# Patient Record
Sex: Female | Born: 2000
Health system: Southern US, Community
[De-identification: ages and names within clinical notes are randomized; demographics above are authoritative.]

## PROBLEM LIST (undated history)

## (undated) DIAGNOSIS — G43909 Migraine, unspecified, not intractable, without status migrainosus: Secondary | ICD-10-CM

## (undated) DIAGNOSIS — F419 Anxiety disorder, unspecified: Secondary | ICD-10-CM

## (undated) DIAGNOSIS — T7840XA Allergy, unspecified, initial encounter: Secondary | ICD-10-CM

## (undated) DIAGNOSIS — J45909 Unspecified asthma, uncomplicated: Secondary | ICD-10-CM

## (undated) DIAGNOSIS — E7800 Pure hypercholesterolemia, unspecified: Secondary | ICD-10-CM

## (undated) HISTORY — DX: Unspecified asthma, uncomplicated: J45.909

## (undated) HISTORY — DX: Allergy, unspecified, initial encounter: T78.40XA

## (undated) HISTORY — DX: Anxiety disorder, unspecified: F41.9

## (undated) HISTORY — DX: Migraine, unspecified, not intractable, without status migrainosus: G43.909

## (undated) HISTORY — PX: WISDOM TOOTH EXTRACTION: SHX21

## (undated) HISTORY — DX: Pure hypercholesterolemia, unspecified: E78.00

---
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
## 2021-02-17 ENCOUNTER — Other Ambulatory Visit (INDEPENDENT_AMBULATORY_CARE_PROVIDER_SITE_OTHER): Payer: Self-pay | Admitting: Primary Care

## 2021-02-17 MED ORDER — BUDESONIDE 0.25 MG/2ML IN SUSP: RESPIRATORY_TRACT | 7 refills | Status: DC

## 2021-02-25 ENCOUNTER — Other Ambulatory Visit (INDEPENDENT_AMBULATORY_CARE_PROVIDER_SITE_OTHER): Payer: Self-pay | Admitting: Primary Care

## 2021-02-25 MED ORDER — ALBUTEROL SULFATE HFA 108 (90 BASE) MCG/ACT IN AERS: 2.0000 | INHALATION_SPRAY | Freq: Four times a day (QID) | RESPIRATORY_TRACT | 1 refills | Status: DC | PRN

## 2021-02-27 ENCOUNTER — Other Ambulatory Visit (INDEPENDENT_AMBULATORY_CARE_PROVIDER_SITE_OTHER): Payer: Self-pay | Admitting: Primary Care

## 2021-02-27 MED ORDER — MONTELUKAST SODIUM 10 MG PO TABS: 10.0000 mg | ORAL_TABLET | Freq: Every day | ORAL | 0 refills | Status: DC

## 2021-02-27 NOTE — Telephone Encounter (Signed)
Medication Refill - Medication: singulair 10 mg  Has the patient contacted their pharmacy? Yes.  Medication was denied. Pt has made an appt for 03-19-2021 with [facility] (with phone number or street name): [facility] [address] phone number [phone] Has the patient been seen for an appointment in the last year OR does the patient have an upcoming appointment? Yes.  Appt on 03-19-2021  Agent: Please be advised that RX refills may take up to 3 business days. We ask that you follow-up with your pharmacy.

## 2021-03-01 ENCOUNTER — Other Ambulatory Visit (INDEPENDENT_AMBULATORY_CARE_PROVIDER_SITE_OTHER): Payer: Self-pay | Admitting: Nurse Practitioner

## 2021-03-03 ENCOUNTER — Encounter (INDEPENDENT_AMBULATORY_CARE_PROVIDER_SITE_OTHER): Payer: Self-pay | Admitting: Primary Care

## 2021-03-03 ENCOUNTER — Ambulatory Visit (INDEPENDENT_AMBULATORY_CARE_PROVIDER_SITE_OTHER): Payer: Medicaid Other | Admitting: Primary Care

## 2021-03-03 ENCOUNTER — Other Ambulatory Visit: Payer: Self-pay

## 2021-03-03 VITALS — BP 134/80 | HR 97 | Temp 97.7°F | Ht 62.0 in | Wt 218.0 lb

## 2021-03-03 DIAGNOSIS — Z23 Encounter for immunization: Secondary | ICD-10-CM | POA: Diagnosis not present

## 2021-03-03 DIAGNOSIS — J4520 Mild intermittent asthma, uncomplicated: Secondary | ICD-10-CM

## 2021-03-03 DIAGNOSIS — R030 Elevated blood-pressure reading, without diagnosis of hypertension: Secondary | ICD-10-CM | POA: Diagnosis not present

## 2021-03-03 DIAGNOSIS — F4010 Social phobia, unspecified: Secondary | ICD-10-CM | POA: Diagnosis not present

## 2021-03-03 MED ORDER — PROPRANOLOL HCL 20 MG PO TABS: ORAL_TABLET | ORAL | 1 refills | Status: DC

## 2021-03-03 MED ORDER — BUDESONIDE-FORMOTEROL FUMARATE 80-4.5 MCG/ACT IN AERO: 2.0000 | INHALATION_SPRAY | Freq: Two times a day (BID) | RESPIRATORY_TRACT | 3 refills | Status: DC

## 2021-03-03 NOTE — Progress Notes (Signed)
[facility]   Subjective:     Mary Rose is an 20 y.o. obese female who presents for follow up of asthma. The patient is not currently have symptoms / an exacerbation. The patient has been having episodes for approximately  12  years. Symptoms in previous episodes have included dyspnea, non-productive cough, and wheezing, and typically last  flares up with season chage. Previous episodes have been triggered by animal dander, carpet in the room, emotional upset, fumes, pollens, and smoke. Treatments tried during prior episodes include short-acting inhaled beta-adrenergic agonists, which usually provides complete resolution of symptoms.  Current Disease Severity Leotta has monthly daytime asthma symptoms. She has frequent nighttime asthma symptoms. The patient is using short-acting beta agonists for symptom control more than 2 days per week but not more than once a day. She has exacerbations requiring oral systemic corticosteroids she did not realize QVAR was long acting . Current limitations in activity from asthma: none. Number of days of school or work missed in the last month: not applicable. Number of urgent/emergent visit in last year: 0   The following portions of the patient's history were reviewed and updated as appropriate: allergies, current medications, past family history, past medical history, past social history, and past surgical history.  Review of Systems Pertinent items noted in HPI and remainder of comprehensive ROS otherwise negative.    Objective:   BP 134/80 (BP Location: Right Arm, Patient Position: Sitting, Cuff Size: Large)   Pulse 97   Temp 97.7 F (36.5 C) (Temporal)   Ht 5\' 2"  (1.575 m)   Wt 218 lb (98.9 kg)   LMP 02/17/2021 (Exact Date)   SpO2 96%   BMI 39.87 kg/m   General: No apparent distress. Eyes: Extraocular eye movements intact, pupils equal and round. Neck: Supple, trachea midline. Thyroid: No enlargement, mobile without  fixation, no tenderness. Cardiovascular: Regular rhythm and rate, no murmur, normal radial pulses. Respiratory: Normal respiratory effort, clear to auscultation. Gastrointestinal: Normal pitch active bowel sounds, nontender abdomen without distention or appreciable hepatomegaly. Musculoskeletal: Normal muscle tone, no tenderness on palpation of tibia, no excessive thoracic kyphosis. Skin: Appropriate warmth, no visible rash. Mental status: Alert, conversant, speech clear, thought logical, appropriate mood and affect, no hallucinations or delusions evident. Hematologic/lymphatic: No cervical adenopathy, no visible ecchymoses.  Assessment:    Intermittent asthma, ongoing.     Plan:    Review treatment goals of symptom prevention, minimizing limitation in activity, and maintenance of optimal pulmonary function. Medications: no change. Discussed distinction between quick-relief and controlled medications. Discussed medication dosage, use, side effects, and goals of treatment in detail.   Reduce exposure to inhaled allergens: use impermeable mattress and pillow covers, vacuum 2x/week (the patient should not do the vacuuming), remove carpets in bedroom, and wash pet weekly.  Keilana was seen today for medication refill.  Diagnoses and all orders for this visit:  Mild intermittent asthma without complication She currently has QVAR Marena Chancy -     Discontinue: budesonide-formoterol (SYMBICORT) 80-4.5 MCG/ACT inhaler; Inhale 2 puffs into the lungs 2 (two) times daily.  Elevated blood pressure reading without diagnosis of hypertension Counseled on blood pressure goal of less than 130/80, low-sodium, DASH diet, medication compliance, 150 minutes of moderate intensity exercise per week. Reviewed how to read labels for sodium intake   Social anxiety disorder -     propranolol (INDERAL) 20 MG tablet; TAKE 1/2 TABLET BY MOUTH FOR ANXIETY FOR 1 WEEK THEN TAKE 1 TABLET AS NEEDED FOR  ANXIETY AND SITUATIONAL  ANXIETY  Grayce Sessions Np

## 2021-03-03 NOTE — Patient Instructions (Addendum)
Influenza, Adult Influenza is also called "the flu." It is an infection in the lungs, nose, and throat (respiratory tract). It spreads easily from person to person (is contagious). The flu causes symptoms that are like a cold, along with high fever and body aches. What are the causes? This condition is caused by the influenza virus. You can get the virus by: Breathing in droplets that are in the air after a person infected with the flu coughed or sneezed. Touching something that has the virus on it and then touching your mouth, nose, or eyes. What increases the risk? Certain things may make you more likely to get the flu. These include: Not washing your hands often. Having close contact with many people during cold and flu season. Touching your mouth, eyes, or nose without first washing your hands. Not getting a flu shot every year. You may have a higher risk for the flu, and serious problems, such as a lung infection (pneumonia), if you: Are older than 65. Are pregnant. Have a weakened disease-fighting system (immune system) because of a disease or because you are taking certain medicines. Have a long-term (chronic) condition, such as: Heart, kidney, or lung disease. Diabetes. Asthma. Have a liver disorder. Are very overweight (morbidly obese). Have anemia. What are the signs or symptoms? Symptoms usually begin suddenly and last 4-14 days. They may include: Fever and chills. Headaches, body aches, or muscle aches. Sore throat. Cough. Runny or stuffy (congested) nose. Feeling discomfort in your chest. Not wanting to eat as much as normal. Feeling weak or tired. Feeling dizzy. Feeling sick to your stomach or throwing up. How is this treated? If the flu is found early, you can be treated with antiviral medicine. This can help to reduce how bad the illness is and how long it lasts. This may be given by mouth or through an IV tube. Taking care of yourself at home can help your  symptoms get better. Your doctor may want you to: Take over-the-counter medicines. Drink plenty of fluids. The flu often goes away on its own. If you have very bad symptoms or other problems, you may be treated in a hospital. Follow these instructions at home:   Activity Rest as needed. Get plenty of sleep. Stay home from work or school as told by your doctor. Do not leave home until you do not have a fever for 24 hours without taking medicine. Leave home only to go to your doctor. Eating and drinking Take an ORS (oral rehydration solution). This is a drink that is sold at pharmacies and stores. Drink enough fluid to keep your pee pale yellow. Drink clear fluids in small amounts as you are able. Clear fluids include: Water. Ice chips. Fruit juice mixed with water. Low-calorie sports drinks. Eat bland foods that are easy to digest. Eat small amounts as you are able. These foods include: Bananas. Applesauce. Rice. Lean meats. Toast. Crackers. Do not eat or drink: Fluids that have a lot of sugar or caffeine. Alcohol. Spicy or fatty foods. General instructions Take over-the-counter and prescription medicines only as told by your doctor. Use a cool mist humidifier to add moisture to the air in your home. This can make it easier for you to breathe. When using a cool mist humidifier, clean it daily. Empty water and replace with clean water. Cover your mouth and nose when you cough or sneeze. Wash your hands with soap and water often and for at least 20 seconds. This is also important after  you cough or sneeze. If you cannot use soap and water, use alcohol-based hand sanitizer. Keep all follow-up visits. How is this prevented?  Get a flu shot every year. You may get the flu shot in late summer, fall, or winter. Ask your doctor when you should get your flu shot. Avoid contact with people who are sick during fall and winter. This is cold and flu season. Contact a doctor if: You get  new symptoms. You have: Chest pain. Watery poop (diarrhea). A fever. Your cough gets worse. You start to have more mucus. You feel sick to your stomach. You throw up. Get help right away if you: Have shortness of breath. Have trouble breathing. Have skin or nails that turn a bluish color. Have very bad pain or stiffness in your neck. Get a sudden headache. Get sudden pain in your face or ear. Cannot eat or drink without throwing up. These symptoms may represent a serious problem that is an emergency. Get medical help right away. Call your local emergency services (911 in the [place] Do not wait to see if the symptoms will go away. Do not drive yourself to the hospital. Summary Influenza is also called "the flu." It is an infection in the lungs, nose, and throat. It spreads easily from person to person. Take over-the-counter and prescription medicines only as told by your doctor. Getting a flu shot every year is the best way to not get the flu. This information is not intended to replace advice given to you by your health care provider. Make sure you discuss any questions you have with your health care provider. Document Revised: 01/11/2020 Document Reviewed: 01/11/2020 Elsevier Patient Education  2022 [employer]. Contraception Choices Contraception, also called birth control, refers to methods or devices that prevent pregnancy. Hormonal methods Contraceptive implant A contraceptive implant is a thin, plastic tube that contains a hormone that prevents pregnancy. It is different from an intrauterine device (IUD). It is inserted into the upper part of the arm by a health care provider. Implants can be effective for up to 3 years. Progestin-only injections Progestin-only injections are injections of progestin, a synthetic form of the hormone progesterone. They are given every 3 months by a health care provider. Birth control pills Birth control pills are pills that contain hormones  that prevent pregnancy. They must be taken once a day, preferably at the same time each day. A prescription is needed to use this method of contraception. Birth control patch The birth control patch contains hormones that prevent pregnancy. It is placed on the skin and must be changed once a week for three weeks and removed on the fourth week. A prescription is needed to use this method of contraception. Vaginal ring A vaginal ring contains hormones that prevent pregnancy. It is placed in the vagina for three weeks and removed on the fourth week. After that, the process is repeated with a new ring. A prescription is needed to use this method of contraception. Emergency contraceptive Emergency contraceptives prevent pregnancy after unprotected sex. They come in pill form and can be taken up to 5 days after sex. They work best the sooner they are taken after having sex. Most emergency contraceptives are available without a prescription. This method should not be used as your only form of birth control. Barrier methods Female condom A female condom is a thin sheath that is worn over the penis during sex. Condoms keep sperm from going inside a woman's body. They can be used with  a sperm-killing substance (spermicide) to increase their effectiveness. They should be thrown away after one use. Female condom A female condom is a soft, loose-fitting sheath that is put into the vagina before sex. The condom keeps sperm from going inside a woman's body. They should be thrown away after one use. Diaphragm A diaphragm is a soft, dome-shaped barrier. It is inserted into the vagina before sex, along with a spermicide. The diaphragm blocks sperm from entering the uterus, and the spermicide kills sperm. A diaphragm should be left in the vagina for 6-8 hours after sex and removed within 24 hours. A diaphragm is prescribed and fitted by a health care provider. A diaphragm should be replaced every 1-2 years, after giving  birth, after gaining more than 15 lb (6.8 kg), and after pelvic surgery. Cervical cap A cervical cap is a round, soft latex or plastic cup that fits over the cervix. It is inserted into the vagina before sex, along with spermicide. It blocks sperm from entering the uterus. The cap should be left in place for 6-8 hours after sex and removed within 48 hours. A cervical cap must be prescribed and fitted by a health care provider. It should be replaced every 2 years. Sponge A sponge is a soft, circular piece of polyurethane foam with spermicide in it. The sponge helps block sperm from entering the uterus, and the spermicide kills sperm. To use it, you make it wet and then insert it into the vagina. It should be inserted before sex, left in for at least 6 hours after sex, and removed and thrown away within 30 hours. Spermicides Spermicides are chemicals that kill or block sperm from entering the cervix and uterus. They can come as a cream, jelly, suppository, foam, or tablet. A spermicide should be inserted into the vagina with an applicator at least 10-15 minutes before sex to allow time for it to work. The process must be repeated every time you have sex. Spermicides do not require a prescription. Intrauterine contraception Intrauterine device (IUD) An IUD is a T-shaped device that is put in a woman's uterus. There are two types: Hormone IUD.This type contains progestin, a synthetic form of the hormone progesterone. This type can stay in place for 3-5 years. Copper IUD.This type is wrapped in copper wire. It can stay in place for 10 years. Permanent methods of contraception Female tubal ligation In this method, a woman's fallopian tubes are sealed, tied, or blocked during surgery to prevent eggs from traveling to the uterus. Hysteroscopic sterilization In this method, a small, flexible insert is placed into each fallopian tube. The inserts cause scar tissue to form in the fallopian tubes and block them,  so sperm cannot reach an egg. The procedure takes about 3 months to be effective. Another form of birth control must be used during those 3 months. Female sterilization This is a procedure to tie off the tubes that carry sperm (vasectomy). After the procedure, the man can still ejaculate fluid (semen). Another form of birth control must be used for 3 months after the procedure. Natural planning methods Natural family planning In this method, a couple does not have sex on days when the woman could become pregnant. Calendar method In this method, the woman keeps track of the length of each menstrual cycle, identifies the days when pregnancy can happen, and does not have sex on those days. Ovulation method In this method, a couple avoids sex during ovulation. Symptothermal method This method involves not having sex  during ovulation. The woman typically checks for ovulation by watching changes in her temperature and in the consistency of cervical mucus. Post-ovulation method In this method, a couple waits to have sex until after ovulation. Where to find more information Centers for Disease Control and Prevention: FootballExhibition.com.br Summary Contraception, also called birth control, refers to methods or devices that prevent pregnancy. Hormonal methods of contraception include implants, injections, pills, patches, vaginal rings, and emergency contraceptives. Barrier methods of contraception can include female condoms, female condoms, diaphragms, cervical caps, sponges, and spermicides. There are two types of IUDs (intrauterine devices). An IUD can be put in a woman's uterus to prevent pregnancy for 3-5 years. Permanent sterilization can be done through a procedure for males and females. Natural family planning methods involve nothaving sex on days when the woman could become pregnant. This information is not intended to replace advice given to you by your health care provider. Make sure you discuss any questions  you have with your health care provider. Document Revised: 10/29/2019 Document Reviewed: 10/29/2019 Elsevier Patient Education  2022 Elsevier Inc. Preventing Hypertension Hypertension, also called high blood pressure, is when the force of blood pumping through the arteries is too strong. Arteries are blood vessels that carry blood from the heart throughout the body. Often, hypertension does not cause symptoms until blood pressure is very high. It is important to have your blood pressure checked regularly. Diet and lifestyle changes can help you prevent hypertension, and they may make you feel better overall and improve your quality of life. If you already have hypertension, you may control it with diet and lifestyle changes, as well as with medicine. How can this condition affect me? Over time, hypertension can damage the arteries and decrease blood flow to important parts of the body, including the brain, heart, and kidneys. By keeping your blood pressure in a healthy range, you can help prevent complications like heart attack, heart failure, stroke, kidney failure, and vascular dementia. What can increase my risk? Being an older adult. Older people are more often affected. Having family members who have had high blood pressure. Being obese. Being female. Males are more likely to have high blood pressure. Drinking too much alcohol or caffeine. Smoking or using illegal drugs. Taking certain medicines, such as antidepressants, decongestants, birth control pills, and NSAIDs, such as ibuprofen. Having thyroid problems. Having certain tumors. What actions can I take to prevent or manage this condition? Work with your health care provider to make a hypertension prevention plan that works for you. Follow your plan and keep all follow-up visits as told by your health care provider. Diet changes Maintain a healthy diet. This includes: Eating less salt (sodium). Ask your health care provider how much  sodium is safe for you to have. The general recommendation is to have less than 1 tsp (2,300 mg) of sodium a day. Do not add salt to your food. Choose low-sodium options when grocery shopping and eating out. Limiting fats in your diet. You can do this by eating low-fat or fat-free dairy products and by eating less red meat. Eating more fruits, vegetables, and whole grains. Make a goal to eat: 1-2 cups of fresh fruits and vegetables each day. 3-4 servings of whole grains each day. Avoiding foods and beverages that have added sugars. Eating fish that contain healthy fats (omega-3 fatty acids), such as mackerel or salmon. If you need help putting together a healthy eating plan, try the DASH diet. This diet is high in fruits, vegetables, and  whole grains. It is low in sodium, red meat, and added sugars. DASH stands for Dietary Approaches to Stop Hypertension. Lifestyle changes Lose weight if you are overweight. Losing just 3?5% of your body weight can help prevent or control hypertension. For example, if your present weight is 200 lb (91 kg), a loss of 3-5% of your weight means losing 6-10 lb (2.7-4.5 kg). Ask your health care provider to help you with a diet and exercise plan to safely lose weight. Other recommendations usually include: Get enough exercise. Do at least 150 minutes of moderate-intensity exercise each week. You could do this in short exercise sessions several times a day, or you could do longer exercise sessions a few times a week. For example, you could take a brisk 10-minute walk or bike ride, 3 times a day, for 5 days a week. Find ways to reduce stress, such as exercising, meditating, listening to music, or taking a yoga class. If you need help reducing stress, ask your health care provider. Do not use any products that contain nicotine or tobacco, such as cigarettes, e-cigarettes, and chewing tobacco. If you need help quitting, ask your health care provider. Chemicals in tobacco and  nicotine products raise your blood pressure each time you use them. If you need help quitting, ask your health care provider. Learn how to check your blood pressure at home. Make sure that you know your personal target blood pressure, as told by your health care provider. Try to sleep 7-9 hours per night.  Alcohol use Do not drink alcohol if: Your health care provider tells you not to drink. You are pregnant, may be pregnant, or are planning to become pregnant. If you drink alcohol: Limit how much you use to: 0-1 drink a day for women. 0-2 drinks a day for men. Be aware of how much alcohol is in your drink. In the [place] one drink equals one 12 oz bottle of beer (355 mL), one 5 oz glass of wine (148 mL), or one 1 oz glass of hard liquor (44 mL). Medicines In addition to diet and lifestyle changes, your health care provider may recommend medicines to help lower your blood pressure. In general: You may need to try a few different medicines to find what works best for you. You may need to take more than one medicine. Take over-the-counter and prescription medicines only as told by your health care provider. Questions to ask your health care provider What is my blood pressure goal? How can I lower my risk for high blood pressure? How should I monitor my blood pressure at home? Where to find support Your health care provider can help you prevent hypertension and help you keep your blood pressure at a healthy level. Your local hospital or your community may also provide support services and prevention programs. The American Heart Association offers an online support network at supportnetwork.heart.org Where to find more information Learn more about hypertension from: National Heart, Lung, and Blood Institute: PopSteam.is Centers for Disease Control and Prevention: FootballExhibition.com.br American Academy of Family Physicians: familydoctor.org Learn more about the DASH diet from: National Heart,  Lung, and Blood Institute: PopSteam.is Contact a health care provider if: You think you are having a reaction to medicines you have taken. You have recurrent headaches or feel dizzy. You have swelling in your ankles. You have trouble with your vision. Get help right away if: You have sudden, severe chest, back, or abdominal pain or discomfort. You have shortness of breath. You have a  sudden, severe headache. These symptoms may represent a serious problem that is an emergency. Do not wait to see if the symptoms will go away. Get medical help right away. Call your local emergency services (911 in the [place] Do not drive yourself to the hospital.  Summary Hypertension often does not cause any symptoms until blood pressure is very high. It is important to get your blood pressure checked regularly. Diet and lifestyle changes are important steps in preventing hypertension. By keeping your blood pressure in a healthy range, you may prevent complications like heart attack, heart failure, stroke, and kidney failure. Work with your health care provider to make a hypertension prevention plan that works for you. This information is not intended to replace advice given to you by your health care provider. Make sure you discuss any questions you have with your health care provider. Document Revised: 04/24/2019 Document Reviewed: 04/24/2019 Elsevier Patient Education  2022 [employer].

## 2021-03-19 ENCOUNTER — Ambulatory Visit (INDEPENDENT_AMBULATORY_CARE_PROVIDER_SITE_OTHER): Payer: Medicaid Other | Admitting: Primary Care

## 2021-10-06 ENCOUNTER — Other Ambulatory Visit (INDEPENDENT_AMBULATORY_CARE_PROVIDER_SITE_OTHER): Payer: Self-pay | Admitting: Primary Care

## 2021-10-06 NOTE — Telephone Encounter (Signed)
Requested Prescriptions  ?Pending Prescriptions Disp Refills  ?? montelukast (SINGULAIR) 10 MG tablet [Pharmacy Med Name: MONTELUKAST 10MG  TABLETS] 90 tablet 0  ?  Sig: TAKE 1 TABLET(10 MG) BY MOUTH AT BEDTIME  ?  ? Pulmonology:  Leukotriene Inhibitors Passed - 10/06/2021  6:56 AM  ?  ?  Passed - Valid encounter within last 12 months  ?  Recent Outpatient Visits   ?      ? 7 months ago Mild intermittent asthma without complication  ? [facility], NP  ? 1 year ago Chronic pain of left knee  ? [facility], NP  ? 1 year ago Routine general medical examination at a health care facility  ? [facility], NP  ? 1 year ago Vaginal discharge  ? [facility], NP  ? 2 years ago Allergy, sequela  ? [facility], FNP  ?  ?  ? ?  ?  ?  ? ? ?

## 2022-02-22 ENCOUNTER — Ambulatory Visit (INDEPENDENT_AMBULATORY_CARE_PROVIDER_SITE_OTHER): Payer: Medicaid Other | Admitting: Family

## 2022-02-22 ENCOUNTER — Encounter: Payer: Self-pay | Admitting: Family

## 2022-02-22 VITALS — BP 136/98 | HR 76 | Temp 97.9°F | Ht 62.0 in | Wt 223.2 lb

## 2022-02-22 DIAGNOSIS — Z23 Encounter for immunization: Secondary | ICD-10-CM

## 2022-02-22 DIAGNOSIS — J4520 Mild intermittent asthma, uncomplicated: Secondary | ICD-10-CM

## 2022-02-22 DIAGNOSIS — G4762 Sleep related leg cramps: Secondary | ICD-10-CM | POA: Diagnosis not present

## 2022-02-22 NOTE — Patient Instructions (Addendum)
Welcome to [facility], It was a pleasure meeting you today!   For your leg cramp overnight, be sure to hydrate with 2 liters of water every day. OK to take Magnesium glycinate (look for chelated for better absorption) 1-2 times per day can help with this and has other benefits also: anxiety, maintaining blood pressure and daily bowels. Also be sure you are not sleeping under heavy covers or unable to move your legs during the night as this can bring on cramps.    PLEASE NOTE: If you had any LAB tests please let us know if you have not heard back within a few days. You may see your results on MyChart before we have a chance to review them but we will give you a call once they are reviewed by [place]. If we ordered any REFERRALS today, please let us know if you have not heard from their office within the next week.  Let us know through MyChart if you are needing REFILLS, or have your pharmacy send [place] the request. You can also use MyChart to communicate with me or any office staff.   Please try these tips to maintain a healthy lifestyle:  Eat most of your calories during the day when you are active. Eliminate processed foods including packaged sweets (pies, cakes, cookies), reduce intake of potatoes, white bread, white pasta, and white rice. Look for whole grain options, oat flour or almond flour.  Each meal should contain half fruits/vegetables, one quarter protein, and one quarter carbs (no bigger than a computer mouse).  Cut down on sweet beverages. This includes juice, soda, and sweet tea. Also watch fruit intake, though this is a healthier sweet option, it still contains natural sugar! Limit to 3 servings daily.  Drink at least 1 8oz. glass of water with each meal and aim for at least 8 glasses per day  Exercise at least 150 minutes every week.

## 2022-02-22 NOTE — Progress Notes (Signed)
New Patient Office Visit  Subjective:  Patient ID: Mary Rose, female    DOB: [birth year]  Age: 21 y.o. MRN: [id number]  CC:  Chief Complaint  Patient presents with   Establish Care    No concerns    HPI [name] presents for establishing care today.   Asthma:  pt reports using pulmicort in nebulizer and albuterol inhaler prn, takes Singulair qhs, Symbicort bid.  Leg cramps:  reports mostly in left leg, overnight, denies any excessive exercise during day, does not drink 2L water qd, denies pain during day.  Assessment & Plan:   Problem List Items Addressed This Visit       Respiratory   Mild intermittent asthma without complication - Primary    chronic  Symbicort bid, Singulair qhs, Pulmicort via Neb.prn, Albuterol prn no refills needed today f/u 6 mos or prn      Relevant Medications   SYMBICORT 80-4.5 MCG/ACT inhaler   Other Visit Diagnoses     Need for immunization against influenza       Relevant Orders   Flu Vaccine QUAD 6+ mos PF IM (Fluarix Quad PF) (Completed)   Nocturnal leg cramps    - Advised pt on hydrating with 2L water qd, do leg/calf stretches and apply heat to calves for 15-20 min prior to bedtime, keep loose bedding over legs, have room to move during night. OK to take Magnesium glycinate qhs.      Subjective:    Outpatient Medications Prior to Visit  Medication Sig Dispense Refill   albuterol (VENTOLIN HFA) 108 (90 Base) MCG/ACT inhaler Inhale 2 puffs into the lungs every 6 (six) hours as needed. For shortness of breath 18 g 1   budesonide (PULMICORT) 0.25 MG/2ML nebulizer solution TAKE 2 MLS BY NEBULIZATION TWICE DAILY AS NEEDED 60 mL 7   cetirizine (ZYRTEC) 10 MG tablet Take by mouth.     Cholecalciferol (VITAMIN D3) 50 MCG (2000 UT) TABS Take 1 tablet by mouth at bedtime.     montelukast (SINGULAIR) 10 MG tablet TAKE 1 TABLET(10 MG) BY MOUTH AT BEDTIME 90 tablet 0   propranolol (INDERAL) 20 MG tablet TAKE 1/2 TABLET BY MOUTH FOR  ANXIETY FOR 1 WEEK THEN TAKE 1 TABLET AS NEEDED FOR ANXIETY AND SITUATIONAL ANXIETY 90 tablet 1   SYMBICORT 80-4.5 MCG/ACT inhaler Inhale 2 puffs into the lungs 2 (two) times daily.     triamcinolone ointment (KENALOG) 0.1 %      Zinc 50 MG TABS Take 50 mg by mouth at bedtime.     montelukast (SINGULAIR) 10 MG tablet Take 1 tablet by mouth daily.     No facility-administered medications prior to visit.   Past Medical History:  Diagnosis Date   Allergy    Anxiety    Asthma    High cholesterol    Migraines    Past Surgical History:  Procedure Laterality Date   WISDOM TOOTH EXTRACTION      Objective:   Today's Vitals: BP (!) 136/98 (BP Location: Left Arm, Patient Position: Sitting, Cuff Size: Large)   Pulse 76   Temp 97.9 F (36.6 C) (Temporal)   Ht 5\' 2"  (1.575 m)   Wt 223 lb 4 oz (101.3 kg)   LMP 02/22/2022 (Exact Date)   SpO2 98%   BMI 40.83 kg/m   Physical Exam Vitals and nursing note reviewed.  Constitutional:      Appearance: Normal appearance. She is obese.  Cardiovascular:     Rate and Rhythm:  Normal rate and regular rhythm.  Pulmonary:     Effort: Pulmonary effort is normal.     Breath sounds: Normal breath sounds.  Musculoskeletal:        General: Normal range of motion.  Skin:    General: Skin is warm and dry.  Neurological:     Mental Status: She is alert.  Psychiatric:        Mood and Affect: Mood normal.        Behavior: Behavior normal.     Jeanie Sewer, NP

## 2022-02-22 NOTE — Assessment & Plan Note (Signed)
   chronic   Symbicort bid, Singulair qhs, Pulmicort via Neb.prn, Albuterol prn  no refills needed today  f/u 6 mos or prn

## 2022-02-26 ENCOUNTER — Other Ambulatory Visit: Payer: Self-pay | Admitting: Family

## 2022-02-26 ENCOUNTER — Other Ambulatory Visit (INDEPENDENT_AMBULATORY_CARE_PROVIDER_SITE_OTHER): Payer: Self-pay | Admitting: Primary Care

## 2022-02-26 MED ORDER — SYMBICORT 80-4.5 MCG/ACT IN AERO: 2.0000 | INHALATION_SPRAY | Freq: Two times a day (BID) | RESPIRATORY_TRACT | 0 refills | Status: DC

## 2022-03-01 ENCOUNTER — Other Ambulatory Visit: Payer: Self-pay | Admitting: Family

## 2022-04-03 ENCOUNTER — Other Ambulatory Visit: Payer: Self-pay | Admitting: Family

## 2022-05-02 ENCOUNTER — Other Ambulatory Visit: Payer: Self-pay | Admitting: Family

## 2022-05-03 ENCOUNTER — Other Ambulatory Visit: Payer: Self-pay | Admitting: Family

## 2022-05-12 ENCOUNTER — Encounter: Payer: Self-pay | Admitting: Family

## 2022-05-12 ENCOUNTER — Ambulatory Visit (INDEPENDENT_AMBULATORY_CARE_PROVIDER_SITE_OTHER): Payer: Medicaid Other | Admitting: Family

## 2022-05-12 VITALS — BP 120/62 | HR 80 | Temp 98.7°F | Ht 62.0 in | Wt 220.5 lb

## 2022-05-12 DIAGNOSIS — F411 Generalized anxiety disorder: Secondary | ICD-10-CM | POA: Diagnosis not present

## 2022-05-12 DIAGNOSIS — J4520 Mild intermittent asthma, uncomplicated: Secondary | ICD-10-CM

## 2022-05-12 MED ORDER — SYMBICORT 80-4.5 MCG/ACT IN AERO: 2.0000 | INHALATION_SPRAY | Freq: Two times a day (BID) | RESPIRATORY_TRACT | 2 refills | Status: DC

## 2022-05-12 MED ORDER — SYMBICORT 80-4.5 MCG/ACT IN AERO: 2.0000 | INHALATION_SPRAY | Freq: Two times a day (BID) | RESPIRATORY_TRACT | 0 refills | Status: DC

## 2022-05-12 NOTE — Assessment & Plan Note (Signed)
chronic pt taking Propranolol 20mg  qd prn no concerns, no refill needed today f/u 6 mos or prn

## 2022-05-12 NOTE — Assessment & Plan Note (Signed)
chronic - stable Symbicort bid, Singulair qhs, Pulmicort via Neb.prn, Albuterol prn just Symbicort refill needed today f/u 6 mos or prn

## 2022-05-12 NOTE — Progress Notes (Signed)
Patient ID: Mary Rose, female    DOB: 2000-09-30, 21 y.o.   MRN: [id number]  Chief Complaint  Patient presents with   Asthma    Medication refill, Symbicort   HPI: Asthma:  pt reports using pulmicort in nebulizer and albuterol inhaler prn, takes Singulair qhs, Symbicort bid, sx have remained under control, no exacerbations.  Anxiety:  pt reports having for over a year, taking Propranolol 20mg  qd prn, pt reports this is working, no concerns.   Assessment & Plan:   Problem List Items Addressed This Visit       Respiratory   Mild intermittent asthma without complication - Primary    chronic - stable Symbicort bid, Singulair qhs, Pulmicort via Neb.prn, Albuterol prn just Symbicort refill needed today f/u 6 mos or prn      Relevant Medications   SYMBICORT 80-4.5 MCG/ACT inhaler     Other   Generalized anxiety disorder    chronic pt taking Propranolol 20mg  qd prn no concerns, no refill needed today f/u 6 mos or prn       Subjective:    Outpatient Medications Prior to Visit  Medication Sig Dispense Refill   budesonide (PULMICORT) 0.25 MG/2ML nebulizer solution USE 2 ML VIA NEBULIZER TWICE DAILY AS NEEDED 60 mL 7   cetirizine (ZYRTEC) 10 MG tablet Take by mouth.     Cholecalciferol (VITAMIN D3) 50 MCG (2000 UT) TABS Take 1 tablet by mouth at bedtime.     montelukast (SINGULAIR) 10 MG tablet TAKE 1 TABLET(10 MG) BY MOUTH AT BEDTIME 90 tablet 0   propranolol (INDERAL) 20 MG tablet TAKE 1/2 TABLET BY MOUTH FOR ANXIETY FOR 1 WEEK THEN TAKE 1 TABLET AS NEEDED FOR ANXIETY AND SITUATIONAL ANXIETY 90 tablet 1   triamcinolone ointment (KENALOG) 0.1 %      VENTOLIN HFA 108 (90 Base) MCG/ACT inhaler INHALE 2 PUFFS INTO THE LUNGS EVERY 4 HOURS FOR UP TO 7 DAYS AS NEEDED FOR WHEEZING 18 g 1   Zinc 50 MG TABS Take 50 mg by mouth at bedtime.     SYMBICORT 80-4.5 MCG/ACT inhaler INHALE 2 PUFFS INTO THE LUNGS TWICE DAILY 10.2 g 0   No facility-administered medications prior to  visit.   Past Medical History:  Diagnosis Date   Allergy    Anxiety    Asthma    High cholesterol    Migraines    Past Surgical History:  Procedure Laterality Date   WISDOM TOOTH EXTRACTION     Allergies  Allergen Reactions   Molds & Smuts Shortness Of Breath      Objective:    Physical Exam Vitals and nursing note reviewed.  Constitutional:      Appearance: Normal appearance. She is obese.  Cardiovascular:     Rate and Rhythm: Normal rate and regular rhythm.  Pulmonary:     Effort: Pulmonary effort is normal.     Breath sounds: Normal breath sounds.  Musculoskeletal:        General: Normal range of motion.  Skin:    General: Skin is warm and dry.  Neurological:     Mental Status: She is alert.  Psychiatric:        Mood and Affect: Mood normal.        Behavior: Behavior normal.    BP 120/62 (BP Location: Left Arm, Patient Position: Sitting, Cuff Size: Large)   Pulse 80   Temp 98.7 F (37.1 C) (Temporal)   Ht 5\' 2"  (1.575 m)   Wt  220 lb 8 oz (100 kg)   LMP 04/22/2022 (Exact Date)   SpO2 98%   BMI 40.33 kg/m  Wt Readings from Last 3 Encounters:  05/12/22 220 lb 8 oz (100 kg)  02/22/22 223 lb 4 oz (101.3 kg)  03/03/21 218 lb (98.9 kg) (99 %, Z= 2.18)*   * Growth percentiles are based on CDC (Girls, 2-20 Years) data.       Jeanie Sewer, NP

## 2022-05-24 ENCOUNTER — Encounter: Payer: Medicaid Other | Admitting: Family

## 2022-05-25 ENCOUNTER — Encounter: Payer: Self-pay | Admitting: Family

## 2022-05-25 ENCOUNTER — Ambulatory Visit (INDEPENDENT_AMBULATORY_CARE_PROVIDER_SITE_OTHER): Payer: Medicaid Other | Admitting: Family

## 2022-05-25 VITALS — BP 125/85 | HR 85 | Temp 97.9°F | Ht 62.0 in | Wt 218.2 lb

## 2022-05-25 DIAGNOSIS — Z0000 Encounter for general adult medical examination without abnormal findings: Secondary | ICD-10-CM | POA: Diagnosis not present

## 2022-05-25 LAB — CBC WITH DIFFERENTIAL/PLATELET
Basophils Absolute: 0 10*3/uL (ref 0.0–0.1)
Basophils Relative: 0.6 % (ref 0.0–3.0)
Eosinophils Absolute: 0.4 10*3/uL (ref 0.0–0.7)
Eosinophils Relative: 5.9 % — ABNORMAL HIGH (ref 0.0–5.0)
HCT: 38.4 % (ref 36.0–46.0)
Hemoglobin: 12.7 g/dL (ref 12.0–15.0)
Lymphocytes Relative: 26.2 % (ref 12.0–46.0)
Lymphs Abs: 1.9 10*3/uL (ref 0.7–4.0)
MCHC: 33.2 g/dL (ref 30.0–36.0)
MCV: 88.5 fl (ref 78.0–100.0)
Monocytes Absolute: 0.6 10*3/uL (ref 0.1–1.0)
Monocytes Relative: 8.5 % (ref 3.0–12.0)
Neutro Abs: 4.4 10*3/uL (ref 1.4–7.7)
Neutrophils Relative %: 58.8 % (ref 43.0–77.0)
Platelets: 336 10*3/uL (ref 150.0–400.0)
RBC: 4.34 Mil/uL (ref 3.87–5.11)
RDW: 13.7 % (ref 11.5–14.6)
WBC: 7.4 10*3/uL (ref 4.5–10.5)

## 2022-05-25 LAB — COMPREHENSIVE METABOLIC PANEL
ALT: 20 U/L (ref 0–35)
AST: 14 U/L (ref 0–37)
Albumin: 4.5 g/dL (ref 3.5–5.2)
Alkaline Phosphatase: 55 U/L (ref 39–117)
BUN: 12 mg/dL (ref 6–23)
CO2: 28 mEq/L (ref 19–32)
Calcium: 9.5 mg/dL (ref 8.4–10.5)
Chloride: 102 mEq/L (ref 96–112)
Creatinine, Ser: 0.65 mg/dL (ref 0.40–1.20)
GFR: 126.44 mL/min (ref >60.00)
Glucose, Bld: 83 mg/dL (ref 70–99)
Potassium: 4.4 mEq/L (ref 3.5–5.1)
Sodium: 139 mEq/L (ref 135–145)
Total Bilirubin: 0.6 mg/dL (ref 0.2–1.2)
Total Protein: 7.1 g/dL (ref 6.0–8.3)

## 2022-05-25 LAB — TSH: TSH: 1.87 u[IU]/mL (ref 0.35–5.50)

## 2022-05-25 LAB — LIPID PANEL
Cholesterol: 181 mg/dL (ref 0–200)
HDL: 40.9 mg/dL (ref >39.00)
LDL Cholesterol: 124 mg/dL — ABNORMAL HIGH (ref 0–99)
NonHDL: 140.24
Total CHOL/HDL Ratio: 4
Triglycerides: 82 mg/dL (ref 0.0–149.0)
VLDL: 16.4 mg/dL (ref 0.0–40.0)

## 2022-05-25 NOTE — Progress Notes (Signed)
Phone [phone]  Subjective:   Patient is a 21 y.o. female presenting for annual physical.    Chief Complaint  Patient presents with   Annual Exam    Fasting w/ Labs   See problem oriented charting- ROS- full  review of systems was completed and negative   The following were reviewed and entered/updated in epic: Past Medical History:  Diagnosis Date   Allergy    Anxiety    Asthma    High cholesterol    Migraines    Patient Active Problem List   Diagnosis Date Noted   Generalized anxiety disorder 05/12/2022   Mild intermittent asthma without complication 62/13/0865   Past Surgical History:  Procedure Laterality Date   WISDOM TOOTH EXTRACTION      Family History  Problem Relation Age of Onset   Heart disease Father    Hyperlipidemia Father    Hypertension Father    Arthritis Maternal Grandmother    Birth defects Maternal Grandfather    Early death Maternal Grandfather     Medications- reviewed and updated Current Outpatient Medications  Medication Sig Dispense Refill   budesonide (PULMICORT) 0.25 MG/2ML nebulizer solution USE 2 ML VIA NEBULIZER TWICE DAILY AS NEEDED 60 mL 7   cetirizine (ZYRTEC) 10 MG tablet Take by mouth.     Cholecalciferol (VITAMIN D3) 50 MCG (2000 UT) TABS Take 1 tablet by mouth at bedtime.     montelukast (SINGULAIR) 10 MG tablet TAKE 1 TABLET(10 MG) BY MOUTH AT BEDTIME 90 tablet 0   propranolol (INDERAL) 20 MG tablet TAKE 1/2 TABLET BY MOUTH FOR ANXIETY FOR 1 WEEK THEN TAKE 1 TABLET AS NEEDED FOR ANXIETY AND SITUATIONAL ANXIETY 90 tablet 1   SYMBICORT 80-4.5 MCG/ACT inhaler Inhale 2 puffs into the lungs 2 (two) times daily. 10.2 g 2   triamcinolone ointment (KENALOG) 0.1 %      VENTOLIN HFA 108 (90 Base) MCG/ACT inhaler INHALE 2 PUFFS INTO THE LUNGS EVERY 4 HOURS FOR UP TO 7 DAYS AS NEEDED FOR WHEEZING 18 g 1   Zinc 50 MG TABS Take 50 mg by mouth at bedtime.     No current facility-administered medications for this visit.    Allergies-reviewed and updated Allergies  Allergen Reactions   Molds & Smuts Shortness Of Breath    Social History   Social History Narrative   pt is a full time Ship broker at [employer], interested in pre-med   working part time    Objective:  BP 125/85 (BP Location: Left Arm, Patient Position: Sitting, Cuff Size: Large)   Pulse 85   Temp 97.9 F (36.6 C) (Temporal)   Ht _0  (1.575 m)   Wt 218 lb 4 oz (99 kg)   LMP 05/21/2022 (Exact Date)   SpO2 99%   BMI 39.92 kg/m  Physical Exam Vitals and nursing note reviewed.  Constitutional:      Appearance: Normal appearance.  HENT:     Head: Normocephalic.     Right Ear: Tympanic membrane normal.     Left Ear: Tympanic membrane normal.     Nose: Nose normal.     Mouth/Throat:     Mouth: Mucous membranes are moist.  Eyes:     Pupils: Pupils are equal, round, and reactive to light.  Cardiovascular:     Rate and Rhythm: Normal rate and regular rhythm.  Pulmonary:     Effort: Pulmonary effort is normal.     Breath sounds: Normal breath sounds.  Musculoskeletal:  General: Normal range of motion.     Cervical back: Normal range of motion.  Lymphadenopathy:     Cervical: No cervical adenopathy.  Skin:    General: Skin is warm and dry.  Neurological:     Mental Status: She is alert.  Psychiatric:        Mood and Affect: Mood normal.        Behavior: Behavior normal.       Assessment and Plan  Health Maintenance counseling: 1. Anticipatory guidance: Patient counseled regarding regular dental exams q6 months, eye exams,  avoiding smoking and second hand smoke, limiting alcohol to 1 beverage per day, no illicit drugs.   2. Risk factor reduction:  Advised patient of need for regular exercise and diet rich with fruits and vegetables to reduce risk of heart attack and stroke. Wt Readings from Last 3 Encounters:  05/25/22 218 lb 4 oz (99 kg)  05/12/22 220 lb 8 oz (100 kg)  02/22/22 223 lb 4 oz (101.3 kg)   3.  Immunizations/screenings/ancillary studies Immunization History  Administered Date(s) Administered   DTaP 10/23/2001, 12/29/2001, 02/16/2002, 01/22/2003, 09/09/2006   HIB (PRP-OMP) 10/23/2001, 12/29/2001, 02/16/2002, 01/22/2003   Hep A, Unspecified 09/09/2006, 07/17/2007   Hepatitis A 09/09/2006, 07/17/2007   Hepatitis B 10/29/2001, 09/21/2001, 05/21/2002   Hepatitis B, PED/ADOLESCENT September 30, 2001, 09/21/2001, 05/21/2002   Hpv-Unspecified 02/09/2013, 02/14/2015   IPV 10/23/2001, 12/29/2001, 05/21/2002, 09/09/2006   Influenza,inj,Quad PF,6+ Mos 07/05/2018, 02/28/2020, 03/03/2021, 02/22/2022   MMR 08/20/2002, 09/09/2006   Td 02/09/2013   Td (Adult),5 Lf Tetanus Toxid, Preservative Free 02/09/2013   Tdap 02/09/2013   Varicella 08/20/2002, 09/09/2006   Health Maintenance Due  Topic Date Due   CHLAMYDIA SCREENING  10/28/2020    4. Cervical cancer screening: due, last one 97yr ago, Gardasil utd 5. Skin cancer screening- advised regular sunscreen use. Denies worrisome, changing, or new skin lesions.  6. Birth control/STD check: none 7. Smoking associated screening: non- smoker 8. Alcohol screening: none  Problem List Items Addressed This Visit   None Visit Diagnoses     Annual physical exam    -  Primary   Relevant Orders   Comprehensive metabolic panel   TSH   Lipid panel   CBC with Differential/Platelet       Recommended follow up:  No follow-ups on file. No future appointments.  Lab/Order associations:fasting    HJeanie Sewer NP

## 2022-05-25 NOTE — Patient Instructions (Signed)
It was very nice to see you today!   I will review your lab results via MyChart in a few days.   Have a very Merry Christmas!!    PLEASE NOTE:  If you had any lab tests please let us know if you have not heard back within a few days. You may see your results on MyChart before we have a chance to review them but we will give you a call once they are reviewed by [place]. If we ordered any referrals today, please let us know if you have not heard from their office within the next week.

## 2022-05-27 ENCOUNTER — Other Ambulatory Visit: Payer: Self-pay | Admitting: Family

## 2022-05-28 NOTE — Progress Notes (Signed)
Your glucose, electrolytes, blood count, thyroid, liver & kidney function are all normal.  Your cholesterol numbers look good, just your LDL (bad #) is high. Try to reduce any fried foods, alcohol, nonnutritional snacks e.g. chips/cookies,pies, cakes and candies, fatty meat (red meat), high fat dairy foods:  including cheese, milk, ice cream.  Increase intake of whole foods: fruits/vegetables/fiber.   Continue or restart an exercise routine, shooting for 5-7days per week.

## 2022-06-19 ENCOUNTER — Ambulatory Visit
Admission: EM | Admit: 2022-06-19 | Discharge: 2022-06-19 | Disposition: A | Discharge status: Home / Self Care | Payer: Medicaid Other | Attending: Emergency Medicine | Admitting: Emergency Medicine

## 2022-06-19 ENCOUNTER — Encounter: Payer: Self-pay | Admitting: Emergency Medicine

## 2022-06-19 DIAGNOSIS — Z113 Encounter for screening for infections with a predominantly sexual mode of transmission: Secondary | ICD-10-CM | POA: Diagnosis not present

## 2022-06-19 LAB — POCT URINALYSIS DIP (MANUAL ENTRY)
Bilirubin, UA: NEGATIVE
Glucose, UA: NEGATIVE mg/dL
Leukocytes, UA: NEGATIVE
Nitrite, UA: NEGATIVE
Protein Ur, POC: 30 mg/dL — AB
Spec Grav, UA: >=1.03 — AB (ref 1.010–1.025)
Urobilinogen, UA: 0.2 E.U./dL
pH, UA: 5.5 (ref 5.0–8.0)

## 2022-06-19 LAB — POCT URINE PREGNANCY: Preg Test, Ur: NEGATIVE

## 2022-06-19 NOTE — Discharge Instructions (Addendum)
The results of your STD testing today will be posted to your MyChart account once it is complete.  This typically takes 2 to 4 days.  Please abstain from sexual intercourse of any kind, vaginal, oral or anal, until you have received the results of your STD testing.   If any of your results are abnormal, you will receive a phone call regarding treatment.  Prescriptions, if any are needed, will be provided for you at your pharmacy.   Your urine pregnancy test today is negative. Our point-of-care analysis of your urine sample today was normal and did not reveal any concern for urinary tract infection. Thank you for visiting urgent care today.

## 2022-06-19 NOTE — ED Triage Notes (Signed)
Patient requesting STI screening including bloodwork.  No sx's.  Just wants a check up.

## 2022-06-19 NOTE — ED Provider Notes (Signed)
[facility]    CSN: [id number] Arrival date & time: 06/19/22  1548    HISTORY   Chief Complaint  Patient presents with   STI testing   HPI Britteny Fiebelkorn is a pleasant, 22 y.o. female who presents to urgent care today. Patient requesting STI screening including bloodwork.  Denies symptoms of STDs, states that she just wants a check up.  The history is provided by the patient.   Past Medical History:  Diagnosis Date   Allergy    Anxiety    Asthma    High cholesterol    Migraines    Patient Active Problem List   Diagnosis Date Noted   Generalized anxiety disorder 05/12/2022   Mild intermittent asthma without complication 37/62/8315   Past Surgical History:  Procedure Laterality Date   WISDOM TOOTH EXTRACTION     OB History   No obstetric history on file.    Home Medications    Prior to Admission medications   Medication Sig Start Date End Date Taking? Authorizing Provider  budesonide (PULMICORT) 0.25 MG/2ML nebulizer solution USE 2 ML VIA NEBULIZER TWICE DAILY AS NEEDED 03/02/22  Yes Hudnell, Colletta [place], NP  cetirizine (ZYRTEC) 10 MG tablet Take by mouth.   Yes [provider]  Cholecalciferol (VITAMIN D3) 50 MCG (2000 UT) TABS Take 1 tablet by mouth at bedtime.   Yes [provider]  montelukast (SINGULAIR) 10 MG tablet TAKE 1 TABLET(10 MG) BY MOUTH AT BEDTIME 05/27/22  Yes Hudnell, Stephanie, NP  propranolol (INDERAL) 20 MG tablet TAKE 1/2 TABLET BY MOUTH FOR ANXIETY FOR 1 WEEK THEN TAKE 1 TABLET AS NEEDED FOR ANXIETY AND SITUATIONAL ANXIETY 03/03/21  Yes Kerin Perna, NP  SYMBICORT 80-4.5 MCG/ACT inhaler Inhale 2 puffs into the lungs 2 (two) times daily. 05/12/22  Yes Jeanie Sewer, NP  triamcinolone ointment (KENALOG) 0.1 %  07/01/18  Yes [provider]  VENTOLIN HFA 108 (90 Base) MCG/ACT inhaler INHALE 2 PUFFS INTO THE LUNGS EVERY 4 HOURS FOR UP TO 7 DAYS AS NEEDED FOR WHEEZING 05/03/22  Yes Hudnell, Colletta [place], NP   Zinc 50 MG TABS Take 50 mg by mouth at bedtime.   Yes [provider]    Family History Family History  Problem Relation Age of Onset   Heart disease Father    Hyperlipidemia Father    Hypertension Father    Arthritis Maternal Grandmother    Birth defects Maternal Grandfather    Early death Maternal Grandfather    Social History Social History   Tobacco Use   Smoking status: Never   Smokeless tobacco: Never  Vaping Use   Vaping Use: Never used  Substance Use Topics   Alcohol use: Never   Drug use: Never   Allergies   Molds & smuts  Review of Systems Review of Systems Pertinent findings revealed after performing a 14 point review of systems has been noted in the history of present illness.  Physical Exam Triage Vital Signs ED Triage Vitals  Enc Vitals Group     BP 04/03/21 0827 (!) 147/82     Pulse Rate 04/03/21 0827 72     Resp 04/03/21 0827 18     Temp 04/03/21 0827 98.3 F (36.8 C)     Temp Source 04/03/21 0827 Oral     SpO2 04/03/21 0827 98 %     Weight --      Height --      Head Circumference --      Peak Flow --  Pain Score 04/03/21 0826 5     Pain Loc --      Pain Edu? --      Excl. in [place]? --   No data found.  Updated Vital Signs BP 131/79 (BP Location: Right Arm)   Pulse 73   Temp 99 F (37.2 C) (Oral)   Resp 18   Ht 5\' 1"  (1.549 m)   Wt 218 lb (98.9 kg)   LMP 05/21/2022 (Exact Date)   SpO2 98%   BMI 41.19 kg/m   Physical Exam Vitals and nursing note reviewed.  Constitutional:      General: She is not in acute distress.    Appearance: Normal appearance. She is not ill-appearing.  HENT:     Head: Normocephalic and atraumatic.  Eyes:     General: Lids are normal.        Right eye: No discharge.        Left eye: No discharge.     Extraocular Movements: Extraocular movements intact.     Conjunctiva/sclera: Conjunctivae normal.     Right eye: Right conjunctiva is not injected.     Left eye: Left conjunctiva is not  injected.  Neck:     Trachea: Trachea and phonation normal.  Cardiovascular:     Rate and Rhythm: Normal rate and regular rhythm.     Pulses: Normal pulses.     Heart sounds: Normal heart sounds. No murmur heard.    No friction rub. No gallop.  Pulmonary:     Effort: Pulmonary effort is normal. No accessory muscle usage, prolonged expiration or respiratory distress.     Breath sounds: Normal breath sounds. No stridor, decreased air movement or transmitted upper airway sounds. No decreased breath sounds, wheezing, rhonchi or rales.  Chest:     Chest wall: No tenderness.  Genitourinary:    Comments: Patient politely declines pelvic exam today, patient provided a vaginal swab for testing. Musculoskeletal:        General: Normal range of motion.     Cervical back: Normal range of motion and neck supple. Normal range of motion.  Lymphadenopathy:     Cervical: No cervical adenopathy.  Skin:    General: Skin is warm and dry.     Findings: No erythema or rash.  Neurological:     General: No focal deficit present.     Mental Status: She is alert and oriented to person, place, and time.  Psychiatric:        Mood and Affect: Mood normal.        Behavior: Behavior normal.     Visual Acuity Right Eye Distance:   Left Eye Distance:   Bilateral Distance:    Right Eye Near:   Left Eye Near:    Bilateral Near:     [facility] Couse / Diagnostics / Procedures:     Radiology No results found.  Procedures Procedures (including critical care time) EKG  Pending results:  Labs Reviewed  HIV ANTIBODY (ROUTINE TESTING W REFLEX)  RPR  POCT URINALYSIS DIP (MANUAL ENTRY)  POCT URINE PREGNANCY  CERVICOVAGINAL ANCILLARY ONLY    Medications Ordered in UC: Medications - No data to display  UC Diagnoses / Final Clinical Impressions(s)   I have reviewed the triage vital signs and the nursing notes.  Pertinent labs & imaging results that were available during my care of the patient were  reviewed by me and considered in my medical decision making (see chart for details).    Final diagnoses:  Screening examination for STD (sexually transmitted disease)   STD screening was performed, patient advised that the results be posted to their MyChart and if any of the results are positive, they will be notified by phone, further treatment will be provided as indicated based on results of STD screening. Patient was advised to abstain from sexual intercourse until that they receive the results of their STD testing.  Patient was also advised to use condoms to protect themselves from STD exposure. Urinalysis today was normal. Urine pregnancy test was negative.    Please see discharge instructions below for details of plan of care as provided to patient. ED Prescriptions   None    PDMP not reviewed this encounter.  Disposition Upon Discharge:  Condition: stable for discharge home  Patient presented with concern for an acute illness with associated systemic symptoms and significant discomfort requiring urgent management. In my opinion, this is a condition that a prudent lay person (someone who possesses an average knowledge of health and medicine) may potentially expect to result in complications if not addressed urgently such as respiratory distress, impairment of bodily function or dysfunction of bodily organs.   As such, the patient has been evaluated and assessed, work-up was performed and treatment was provided in alignment with urgent care protocols and evidence based medicine.  Patient/parent/caregiver has been advised that the patient may require follow up for further testing and/or treatment if the symptoms continue in spite of treatment, as clinically indicated and appropriate.  Routine symptom specific, illness specific and/or disease specific instructions were discussed with the patient and/or caregiver at length.  Prevention strategies for avoiding STD exposure were also  discussed.  The patient will follow up with their current PCP if and as advised. If the patient does not currently have a PCP we will assist them in obtaining one.   The patient may need specialty follow up if the symptoms continue, in spite of conservative treatment and management, for further workup, evaluation, consultation and treatment as clinically indicated and appropriate.  Patient/parent/caregiver verbalized understanding and agreement of plan as discussed.  All questions were addressed during visit.  Please see discharge instructions below for further details of plan.  Discharge Instructions:   Discharge Instructions      The results of your STD testing today will be posted to your MyChart account once it is complete.  This typically takes 2 to 4 days.  Please abstain from sexual intercourse of any kind, vaginal, oral or anal, until you have received the results of your STD testing.   If any of your results are abnormal, you will receive a phone call regarding treatment.  Prescriptions, if any are needed, will be provided for you at your pharmacy.   Your urine pregnancy test today is negative. Our point-of-care analysis of your urine sample today was normal and did not reveal any concern for urinary tract infection. Thank you for visiting urgent care today.      This office note has been dictated using Teaching laboratory technician.  Unfortunately, this method of dictation can sometimes lead to typographical or grammatical errors.  I apologize for your inconvenience in advance if this occurs.  Please do not hesitate to reach out to me if clarification is needed.       Theadora Rama Scales, PA-C 06/19/22 1610

## 2022-06-21 ENCOUNTER — Telehealth (HOSPITAL_COMMUNITY): Payer: Self-pay | Admitting: Emergency Medicine

## 2022-06-21 LAB — CERVICOVAGINAL ANCILLARY ONLY
Bacterial Vaginitis (gardnerella): POSITIVE — AB
Candida Glabrata: NEGATIVE
Candida Vaginitis: POSITIVE — AB
Chlamydia: NEGATIVE
Comment: NEGATIVE
Comment: NEGATIVE
Comment: NEGATIVE
Comment: NEGATIVE
Comment: NEGATIVE
Comment: NORMAL
Neisseria Gonorrhea: NEGATIVE
Trichomonas: NEGATIVE

## 2022-06-21 MED ORDER — METRONIDAZOLE 500 MG PO TABS: 500.0000 mg | ORAL_TABLET | Freq: Two times a day (BID) | ORAL | 0 refills | Status: DC

## 2022-06-21 MED ORDER — FLUCONAZOLE 150 MG PO TABS: 150.0000 mg | ORAL_TABLET | Freq: Once | ORAL | 0 refills | Status: AC

## 2022-06-22 LAB — HIV ANTIBODY (ROUTINE TESTING W REFLEX): HIV Screen 4th Generation wRfx: NONREACTIVE

## 2022-06-22 LAB — RPR: RPR Ser Ql: NONREACTIVE

## 2022-09-02 ENCOUNTER — Other Ambulatory Visit: Payer: Self-pay | Admitting: Family

## 2022-12-01 ENCOUNTER — Other Ambulatory Visit: Payer: Self-pay | Admitting: Family

## 2022-12-23 ENCOUNTER — Other Ambulatory Visit: Payer: Self-pay | Admitting: Family

## 2022-12-23 DIAGNOSIS — J4520 Mild intermittent asthma, uncomplicated: Secondary | ICD-10-CM

## 2022-12-27 ENCOUNTER — Ambulatory Visit: Payer: Medicaid Other | Admitting: Family

## 2022-12-27 ENCOUNTER — Other Ambulatory Visit (HOSPITAL_COMMUNITY)
Admission: RE | Admit: 2022-12-27 | Discharge: 2022-12-27 | Disposition: A | Discharge status: Home / Self Care | Payer: Medicaid Other | Source: Ambulatory Visit | Attending: Family | Admitting: Family

## 2022-12-27 VITALS — BP 126/82 | HR 70 | Temp 98.0°F | Ht 61.0 in | Wt 220.0 lb

## 2022-12-27 DIAGNOSIS — J4540 Moderate persistent asthma, uncomplicated: Secondary | ICD-10-CM | POA: Diagnosis not present

## 2022-12-27 DIAGNOSIS — Z124 Encounter for screening for malignant neoplasm of cervix: Secondary | ICD-10-CM | POA: Insufficient documentation

## 2022-12-27 DIAGNOSIS — Z6841 Body Mass Index (BMI) 40.0 and over, adult: Secondary | ICD-10-CM

## 2022-12-27 DIAGNOSIS — R635 Abnormal weight gain: Secondary | ICD-10-CM | POA: Insufficient documentation

## 2022-12-27 MED ORDER — BUDESONIDE-FORMOTEROL FUMARATE 160-4.5 MCG/ACT IN AERO: 2.0000 | INHALATION_SPRAY | Freq: Two times a day (BID) | RESPIRATORY_TRACT | 3 refills | Status: DC

## 2022-12-27 NOTE — Patient Instructions (Addendum)
It was very nice to see you today!  I will review your lab results via MyChart in a few days.  As discussed, increase your Symbicort to 3 puffs in am and 2 puffs in pm until current inhaler is done, then I will send the higher dose. If symptoms are still not controlled, let me know and I will refer you to Pulmonology.  Start working on what we discussed today regarding your weight loss goals:  1) Eat small portions, ok to eat 6 mini meals vs. 3 big meals if this controls your hunger  better. 2) Eat until full and then STOP! This is your body telling you it has had enough. 3) Drink at least 64 oz water = 2 liters = 8, 8oz cups DAILY. 4) Eat most of your calories earlier in the day (if you work during the day).  Want to eat within a 8-10hour window if possible. No eating after 7pm, only no calorie drinks or water from 7p until bedtime. 5) Look for low carb, high protein foods/recipes. Avoid simple carbs including sweets, white bread, rice, potatoes, pasta. Look for whole grain/whole wheat/or almond flour if eating these foods. 6) High protein foods: meats (limit red meat), including [place], chicken, pork, FISH (best source) nuts, cheese, beans (black beans, soy/edamame beans are best). Protein drinks, bars are also good but must have low sugar, look for < 10 grams. 7) Avoid processed/refined sugar and artificial sweeteners if possible. Stevia, Erythritol, or Monk fruit sweetener are preferred, natural, no calorie sweeteners.  Natural sweeteners like Agave or honey in very small amounts are ok also.     PLEASE NOTE:  If you had any lab tests please let us know if you have not heard back within a few days. You may see your results on MyChart before we have a chance to review them but we will give you a call once they are reviewed by [place]. If we ordered any referrals today, please let us know if you have not heard from their office within the next week.

## 2022-12-27 NOTE — Assessment & Plan Note (Signed)
chronic no previous meds or commercial diets Wt. Loss strategies reviewed including portion control, less carbs including sweets, eating most of calories earlier in day, drinking 64oz water qd, and establishing daily exercise routine calorie restriction no less than 1100 per day, cut by 100 qweek handouts provided reinforcing above f/u 1 month, can discuss phentermine

## 2022-12-27 NOTE — Assessment & Plan Note (Signed)
chronic - stable low dose Symbicort bid, Singulair qhs, Pulmicort via Neb.prn, Albuterol prn reports feeling more SOB lately, unsure if d/t weather, weight has not increased advised to increase her symbicort to 3 sprays in am & 2 in evening or 2 am, 1 midday, 2 pm - until current inhaler finished & then can send new dose f/u 6 mos or prn

## 2022-12-27 NOTE — Progress Notes (Signed)
Patient ID: Mary Rose, female    DOB: [birth year], 22 y.o.   MRN: [id number]  Chief Complaint  Patient presents with   Gynecologic Exam   Asthma   Weight Loss    Discuss    HPI: Asthma:  pt reports using pulmicort in nebulizer and albuterol inhaler prn, takes Singulair qhs, Symbicort bid, sx have remained under control, no exacerbations.  Weight loss:  reports cutting her calories, exercising with walking, unable to lose more than 10lbs in past on her own, has never tried a commercial wt loss program.  Encounter for PAP smear testing:  pt reports having a PAP in past, normal, denies any vaginal concerns.  Assessment & Plan:  Moderate persistent asthma without complication Assessment & Plan: chronic - stable low dose Symbicort bid, Singulair qhs, Pulmicort via Neb.prn, Albuterol prn reports feeling more SOB lately, unsure if d/t weather, weight has not increased advised to increase her symbicort to 3 sprays in am & 2 in evening or 2 am, 1 midday, 2 pm - until current inhaler finished & then can send new dose f/u 6 mos or prn  Orders: -     Budesonide-Formoterol Fumarate; Inhale 2 puffs into the lungs 2 (two) times daily.  Dispense: 1 each; Refill: 3  Abnormal weight gain Assessment & Plan: chronic no previous meds or commercial diets Wt. Loss strategies reviewed including portion control, less carbs including sweets, eating most of calories earlier in day, drinking 64oz water qd, and establishing daily exercise routine calorie restriction no less than 1100 per day, cut by 100 qweek handouts provided reinforcing above f/u 1 month, can discuss phentermine   Morbid obesity (HCC) Assessment & Plan: chronic no previous meds or commercial diets Wt. Loss strategies reviewed including portion control, less carbs including sweets, eating most of calories earlier in day, drinking 64oz water qd, and establishing daily exercise routine calorie restriction no less than 1100 per  day, cut by 100 qweek handouts provided reinforcing above f/u 1 month, can discuss phentermine   Encounter for Papanicolaou smear for cervical cancer screening -     Cytology - PAP    Subjective:    Outpatient Medications Prior to Visit  Medication Sig Dispense Refill   budesonide (PULMICORT) 0.25 MG/2ML nebulizer solution USE 2 ML VIA NEBULIZER TWICE DAILY AS NEEDED 60 mL 7   cetirizine (ZYRTEC) 10 MG tablet Take by mouth.     Cholecalciferol (VITAMIN D3) 50 MCG (2000 UT) TABS Take 1 tablet by mouth at bedtime.     MAGNESIUM PO Take by mouth.     montelukast (SINGULAIR) 10 MG tablet TAKE 1 TABLET(10 MG) BY MOUTH AT BEDTIME 90 tablet 0   propranolol (INDERAL) 20 MG tablet TAKE 1/2 TABLET BY MOUTH FOR ANXIETY FOR 1 WEEK THEN TAKE 1 TABLET AS NEEDED FOR ANXIETY AND SITUATIONAL ANXIETY 90 tablet 1   triamcinolone ointment (KENALOG) 0.1 %      VENTOLIN HFA 108 (90 Base) MCG/ACT inhaler INHALE 2 PUFFS INTO THE LUNGS EVERY 4 HOURS FOR UP TO 7 DAYS AS NEEDED FOR WHEEZING 18 g 1   Zinc 50 MG TABS Take 50 mg by mouth at bedtime.     SYMBICORT 80-4.5 MCG/ACT inhaler INHALE 2 PUFFS INTO THE LUNGS TWICE DAILY 10.2 g 2   metroNIDAZOLE (FLAGYL) 500 MG tablet Take 1 tablet (500 mg total) by mouth 2 (two) times daily. (Patient not taking: Reported on 12/27/2022) 14 tablet 0   No facility-administered medications prior to visit.  Past Medical History:  Diagnosis Date   Allergy    Anxiety    Asthma    High cholesterol    Migraines    Past Surgical History:  Procedure Laterality Date   WISDOM TOOTH EXTRACTION     Allergies  Allergen Reactions   Molds & Smuts Shortness Of Breath      Objective:    Physical Exam Vitals and nursing note reviewed. Exam conducted with a chaperone present.  Constitutional:      Appearance: Normal appearance.  Cardiovascular:     Rate and Rhythm: Normal rate and regular rhythm.  Pulmonary:     Effort: Pulmonary effort is normal.     Breath sounds: Normal  breath sounds.  Genitourinary:    Exam position: Lithotomy position.     Pubic Area: No rash or pubic lice.      Labia:        Right: No rash.        Left: No rash.      Vagina: Normal.     Cervix: Normal.     Comments: PAP smear specimen obtained; pt has enlarged, loose labial skin, mostly on left side, no erythema or lesions, pt reports being uncomfortable at times, getting caught in underwear etc. Musculoskeletal:        General: Normal range of motion.  Skin:    General: Skin is warm and dry.  Neurological:     Mental Status: She is alert.  Psychiatric:        Mood and Affect: Mood normal.        Behavior: Behavior normal.    BP 126/82   Pulse 70   Temp 98 F (36.7 C) (Temporal)   Ht 5\' 1"  (1.549 m)   Wt 220 lb (99.8 kg)   SpO2 98%   BMI 41.57 kg/m  Wt Readings from Last 3 Encounters:  12/27/22 220 lb (99.8 kg)  06/19/22 218 lb (98.9 kg)  05/25/22 218 lb 4 oz (99 kg)      Dulce Sellar, NP

## 2022-12-29 LAB — CYTOLOGY - PAP: Diagnosis: NEGATIVE

## 2023-02-01 ENCOUNTER — Ambulatory Visit: Payer: Medicaid Other | Admitting: Critical Care Medicine

## 2023-02-07 ENCOUNTER — Ambulatory Visit
Admission: EM | Admit: 2023-02-07 | Discharge: 2023-02-07 | Disposition: A | Discharge status: Home / Self Care | Payer: Medicaid Other | Attending: Internal Medicine | Admitting: Internal Medicine

## 2023-02-07 DIAGNOSIS — N926 Irregular menstruation, unspecified: Secondary | ICD-10-CM | POA: Insufficient documentation

## 2023-02-07 DIAGNOSIS — N898 Other specified noninflammatory disorders of vagina: Secondary | ICD-10-CM | POA: Diagnosis not present

## 2023-02-07 DIAGNOSIS — Z113 Encounter for screening for infections with a predominantly sexual mode of transmission: Secondary | ICD-10-CM | POA: Insufficient documentation

## 2023-02-07 LAB — POCT URINALYSIS DIP (MANUAL ENTRY)
Bilirubin, UA: NEGATIVE
Blood, UA: NEGATIVE
Glucose, UA: NEGATIVE mg/dL
Ketones, POC UA: NEGATIVE mg/dL
Leukocytes, UA: NEGATIVE
Nitrite, UA: NEGATIVE
Protein Ur, POC: NEGATIVE mg/dL
Spec Grav, UA: 1.015 (ref 1.010–1.025)
Urobilinogen, UA: 0.2 U/dL
pH, UA: 7.5 (ref 5.0–8.0)

## 2023-02-07 LAB — POCT URINE PREGNANCY: Preg Test, Ur: NEGATIVE

## 2023-02-07 NOTE — ED Triage Notes (Signed)
Pt presents to UC for std test/screening. Reports slight green discharge. No known exposre Pt reports period is 1 week late

## 2023-02-07 NOTE — Discharge Instructions (Signed)
The clinic will contact you with results of the testing done today if positive.  Please follow-up with your PCP in 2 days for recheck.  Please go to the ER for any worsening symptoms.  I hope you feel better soon!

## 2023-02-07 NOTE — ED Provider Notes (Signed)
[facility]    CSN: [id number] Arrival date & time: 02/07/23  4098      History   Chief Complaint No chief complaint on file.   HPI Mary Rose is a 22 y.o. female presents for STD screening.  Patient reports 2 days of a slightly green-tinged vaginal discharge that is not pruritic or malodorous.  Denies any increase in discharge as well.  No dysuria, fevers, nausea/vomiting, flank pain.  No known STD exposure but would like screening.  In addition she reports she is 1 week late for her menses.  She is not on birth control.  States she normally is on time every month.  No other concerns at this time.  HPI  Past Medical History:  Diagnosis Date   Allergy    Anxiety    Asthma    High cholesterol    Migraines     Patient Active Problem List   Diagnosis Date Noted   Morbid obesity (HCC) 12/27/2022   Abnormal weight gain 12/27/2022   Generalized anxiety disorder 05/12/2022   Mild intermittent asthma without complication 02/22/2022    Past Surgical History:  Procedure Laterality Date   WISDOM TOOTH EXTRACTION      OB History   No obstetric history on file.      Home Medications    Prior to Admission medications   Medication Sig Start Date End Date Taking? Authorizing Provider  budesonide (PULMICORT) 0.25 MG/2ML nebulizer solution USE 2 ML VIA NEBULIZER TWICE DAILY AS NEEDED 03/02/22   Dulce Sellar, NP  budesonide-formoterol (SYMBICORT) 160-4.5 MCG/ACT inhaler Inhale 2 puffs into the lungs 2 (two) times daily. 12/27/22   Dulce Sellar, NP  cetirizine (ZYRTEC) 10 MG tablet Take by mouth.    [provider]  Cholecalciferol (VITAMIN D3) 50 MCG (2000 UT) TABS Take 1 tablet by mouth at bedtime.    [provider]  MAGNESIUM PO Take by mouth.    [provider]  montelukast (SINGULAIR) 10 MG tablet TAKE 1 TABLET(10 MG) BY MOUTH AT BEDTIME 12/01/22   Hudnell, Judeth Cornfield, NP  propranolol (INDERAL) 20 MG tablet TAKE 1/2 TABLET BY  MOUTH FOR ANXIETY FOR 1 WEEK THEN TAKE 1 TABLET AS NEEDED FOR ANXIETY AND SITUATIONAL ANXIETY 03/03/21   Grayce Sessions, NP  triamcinolone ointment (KENALOG) 0.1 %  07/01/18   [provider]  VENTOLIN HFA 108 (90 Base) MCG/ACT inhaler INHALE 2 PUFFS INTO THE LUNGS EVERY 4 HOURS FOR UP TO 7 DAYS AS NEEDED FOR WHEEZING 05/03/22   Dulce Sellar, NP  Zinc 50 MG TABS Take 50 mg by mouth at bedtime.    [provider]    Family History Family History  Problem Relation Age of Onset   Heart disease Father    Hyperlipidemia Father    Hypertension Father    Arthritis Maternal Grandmother    Birth defects Maternal Grandfather    Early death Maternal Grandfather     Social History Social History   Tobacco Use   Smoking status: Never   Smokeless tobacco: Never  Vaping Use   Vaping status: Never Used  Substance Use Topics   Alcohol use: Never   Drug use: Never     Allergies   Molds & smuts   Review of Systems Review of Systems  Genitourinary:  Positive for menstrual problem and vaginal discharge.     Physical Exam Triage Vital Signs ED Triage Vitals  Encounter Vitals Group     BP 02/07/23 1053 (!) 134/92  Systolic BP Percentile --      Diastolic BP Percentile --      Pulse Rate 02/07/23 1053 77     Resp 02/07/23 1053 16     Temp 02/07/23 1053 98.2 F (36.8 C)     Temp Source 02/07/23 1053 Oral     SpO2 02/07/23 1053 98 %     Weight --      Height --      Head Circumference --      Peak Flow --      Pain Score 02/07/23 1057 0     Pain Loc --      Pain Education --      Exclude from Growth Chart --    No data found.  Updated Vital Signs BP (!) 134/92 (BP Location: Right Arm)   Pulse 77   Temp 98.2 F (36.8 C) (Oral)   Resp 16   LMP 01/09/2023 (Approximate)   SpO2 98%   Visual Acuity Right Eye Distance:   Left Eye Distance:   Bilateral Distance:    Right Eye Near:   Left Eye Near:    Bilateral Near:     Physical  Exam Vitals and nursing note reviewed.  Constitutional:      Appearance: Normal appearance.  HENT:     Head: Normocephalic and atraumatic.  Eyes:     Pupils: Pupils are equal, round, and reactive to light.  Cardiovascular:     Rate and Rhythm: Normal rate.  Pulmonary:     Effort: Pulmonary effort is normal.  Abdominal:     Tenderness: There is no right CVA tenderness or left CVA tenderness.  Skin:    General: Skin is warm and dry.  Neurological:     General: No focal deficit present.     Mental Status: She is alert and oriented to person, place, and time.  Psychiatric:        Mood and Affect: Mood normal.        Behavior: Behavior normal.      UC Treatments / Results  Labs (all labs ordered are listed, but only abnormal results are displayed) Labs Reviewed  POCT URINALYSIS DIP (MANUAL ENTRY) - Abnormal; Notable for the following components:      Result Value   Color, UA colorless (*)    All other components within normal limits  RPR  HIV ANTIBODY (ROUTINE TESTING W REFLEX)  BETA HCG QUANT (REF LAB)  POCT URINE PREGNANCY  CERVICOVAGINAL ANCILLARY ONLY    EKG   Radiology No results found.  Procedures Procedures (including critical care time)  Medications Ordered in UC Medications - No data to display  Initial Impression / Assessment and Plan / UC Course  I have reviewed the triage vital signs and the nursing notes.  Pertinent labs & imaging results that were available during my care of the patient were reviewed by me and considered in my medical decision making (see chart for details).     Reviewed exam and symptoms with patient.  Negative UA and hCG.  Patient requested serum hCG.  STD testing is ordered and will contact for any positive results.  Patient to follow-up with PCP 2 days for recheck.  ER precautions reviewed and patient verbalized understanding. Final Clinical Impressions(s) / UC Diagnoses   Final diagnoses:  Vaginal discharge  Late menses   Screening examination for STD (sexually transmitted disease)     Discharge Instructions      The clinic will contact you with  results of the testing done today if positive.  Please follow-up with your PCP in 2 days for recheck.  Please go to the ER for any worsening symptoms.  I hope you feel better soon!    ED Prescriptions   None    PDMP not reviewed this encounter.   Radford Pax, NP 02/07/23 1119

## 2023-02-09 ENCOUNTER — Telehealth: Payer: Self-pay

## 2023-02-09 LAB — HIV ANTIBODY (ROUTINE TESTING W REFLEX): HIV Screen 4th Generation wRfx: NONREACTIVE

## 2023-02-09 LAB — BETA HCG QUANT (REF LAB): hCG Quant: <1 m[IU]/mL

## 2023-02-09 LAB — RPR: RPR Ser Ql: NONREACTIVE

## 2023-02-09 MED ORDER — FLUCONAZOLE 150 MG PO TABS: 150.0000 mg | ORAL_TABLET | Freq: Once | ORAL | 0 refills | Status: AC

## 2023-02-09 NOTE — Telephone Encounter (Signed)
Per protocol, pt requires tx with Diflucan. Attempted to reach patient x1. LVM. Rx sent to pharmacy on file.

## 2023-02-10 LAB — CERVICOVAGINAL ANCILLARY ONLY
Bacterial Vaginitis (gardnerella): NEGATIVE
Candida Glabrata: NEGATIVE
Candida Vaginitis: POSITIVE — AB
Chlamydia: NEGATIVE
Comment: NEGATIVE
Comment: NEGATIVE
Comment: NEGATIVE
Comment: NEGATIVE
Comment: NEGATIVE
Comment: NORMAL
Neisseria Gonorrhea: NEGATIVE
Trichomonas: NEGATIVE

## 2023-03-01 ENCOUNTER — Other Ambulatory Visit: Payer: Self-pay | Admitting: Family

## 2023-04-22 ENCOUNTER — Other Ambulatory Visit (INDEPENDENT_AMBULATORY_CARE_PROVIDER_SITE_OTHER): Payer: Self-pay | Admitting: Primary Care

## 2023-04-22 DIAGNOSIS — F4010 Social phobia, unspecified: Secondary | ICD-10-CM

## 2023-05-19 ENCOUNTER — Encounter (HOSPITAL_BASED_OUTPATIENT_CLINIC_OR_DEPARTMENT_OTHER): Payer: Self-pay

## 2023-05-19 ENCOUNTER — Other Ambulatory Visit: Payer: Self-pay

## 2023-05-19 ENCOUNTER — Emergency Department (HOSPITAL_BASED_OUTPATIENT_CLINIC_OR_DEPARTMENT_OTHER): Payer: Medicaid Other | Admitting: Radiology

## 2023-05-19 ENCOUNTER — Emergency Department (HOSPITAL_BASED_OUTPATIENT_CLINIC_OR_DEPARTMENT_OTHER)
Admission: EM | Admit: 2023-05-19 | Discharge: 2023-05-19 | Disposition: A | Discharge status: Home / Self Care | Payer: Medicaid Other | Attending: Emergency Medicine | Admitting: Emergency Medicine

## 2023-05-19 DIAGNOSIS — R0602 Shortness of breath: Secondary | ICD-10-CM | POA: Diagnosis present

## 2023-05-19 DIAGNOSIS — J4541 Moderate persistent asthma with (acute) exacerbation: Secondary | ICD-10-CM | POA: Diagnosis not present

## 2023-05-19 DIAGNOSIS — J4540 Moderate persistent asthma, uncomplicated: Secondary | ICD-10-CM

## 2023-05-19 DIAGNOSIS — Z20822 Contact with and (suspected) exposure to covid-19: Secondary | ICD-10-CM | POA: Insufficient documentation

## 2023-05-19 LAB — RESP PANEL BY RT-PCR (RSV, FLU A&B, COVID)  RVPGX2
Influenza A by PCR: NEGATIVE
Influenza B by PCR: NEGATIVE
Resp Syncytial Virus by PCR: NEGATIVE
SARS Coronavirus 2 by RT PCR: NEGATIVE

## 2023-05-19 MED ORDER — IPRATROPIUM-ALBUTEROL 0.5-2.5 (3) MG/3ML IN SOLN
3.0000 mL | Freq: Once | RESPIRATORY_TRACT | Status: AC
  Administered 2023-05-19: 3 mL via RESPIRATORY_TRACT
  Filled 2023-05-19: qty 3

## 2023-05-19 MED ORDER — PREDNISONE 20 MG PO TABS: 40.0000 mg | ORAL_TABLET | Freq: Every day | ORAL | 0 refills | Status: AC

## 2023-05-19 MED ORDER — PREDNISONE 50 MG PO TABS
60.0000 mg | ORAL_TABLET | Freq: Once | ORAL | Status: AC
  Administered 2023-05-19: 60 mg via ORAL
  Filled 2023-05-19: qty 1

## 2023-05-19 MED ORDER — VENTOLIN HFA 108 (90 BASE) MCG/ACT IN AERS: 2.0000 | INHALATION_SPRAY | RESPIRATORY_TRACT | 1 refills | Status: AC | PRN

## 2023-05-19 MED ORDER — SYMBICORT 160-4.5 MCG/ACT IN AERO: 2.0000 | INHALATION_SPRAY | Freq: Two times a day (BID) | RESPIRATORY_TRACT | 0 refills | Status: DC

## 2023-05-19 MED ORDER — BUDESONIDE 0.25 MG/2ML IN SUSP: RESPIRATORY_TRACT | 7 refills | Status: AC

## 2023-05-19 MED ORDER — MONTELUKAST SODIUM 10 MG PO TABS: 10.0000 mg | ORAL_TABLET | Freq: Every evening | ORAL | 1 refills | Status: DC

## 2023-05-19 MED ORDER — ALBUTEROL SULFATE HFA 108 (90 BASE) MCG/ACT IN AERS: 2.0000 | INHALATION_SPRAY | RESPIRATORY_TRACT | Status: DC | PRN

## 2023-05-19 NOTE — Discharge Instructions (Addendum)
You were seen in the emergency department for shortness of breath.  We gave you steroids and a breathing treatment. Your respiratory swab is negative for flu, COVID, and RSV. Your chest x-ray did not show any evidence of pneumonia.  Please continue your home medications as prescribed. Please make your primary doctor aware of your ER visit to continue discussing your asthma medication regimen.   Continue to monitor how you're doing and return to the ER for new or worsening symptoms.

## 2023-05-19 NOTE — ED Provider Notes (Signed)
[place] EMERGENCY DEPARTMENT AT [facility] Provider Note   CSN: [id number] Arrival date & time: 05/19/23  2224     History  Chief Complaint  Patient presents with   Asthma    Mary Rose is a 22 y.o. female who present to the emergency department complaining of shortness of breath.  Patient has history of asthma.  States that she was leaving her dad's house about 30 minutes ago when she had sudden onset of chest tightness and shortness of breath.  She uses montelukast, Pulmicort nebulizers, Symbicort inhaler, and albuterol.  She was trying her Symbicort without relief, used her albuterol and had some relief.  She started feeling very anxious and panicking, and overall describes "just not feeling well".  Has only been hospitalized once for asthma, when he was initially diagnosed when she was a child.  She has never required intubation.   Asthma Associated symptoms include shortness of breath.       Home Medications Prior to Admission medications   Medication Sig Start Date End Date Taking? Authorizing Provider  albuterol (VENTOLIN HFA) 108 (90 Base) MCG/ACT inhaler Inhale 2 puffs into the lungs every 4 (four) hours as needed for wheezing or shortness of breath. 05/19/23  Yes Nykeria Mealing T, PA-C  budesonide-formoterol (SYMBICORT) 160-4.5 MCG/ACT inhaler Inhale 2 puffs into the lungs in the morning and at bedtime. 05/19/23  Yes Laconda Basich T, PA-C  predniSONE (DELTASONE) 20 MG tablet Take 2 tablets (40 mg total) by mouth daily with breakfast for 5 days. 05/19/23 05/24/23 Yes Phila Shoaf T, PA-C  budesonide (PULMICORT) 0.25 MG/2ML nebulizer solution USE 2 ML VIA NEBULIZER TWICE DAILY AS NEEDED 05/19/23   Rishi Vicario T, PA-C  cetirizine (ZYRTEC) 10 MG tablet Take by mouth.    [provider]  Cholecalciferol (VITAMIN D3) 50 MCG (2000 UT) TABS Take 1 tablet by mouth at bedtime.    [provider]  MAGNESIUM PO Take by mouth.     [provider]  montelukast (SINGULAIR) 10 MG tablet Take 1 tablet (10 mg total) by mouth at bedtime. 05/19/23   Naiomi Musto T, PA-C  propranolol (INDERAL) 20 MG tablet TAKE 1/2 TABLET BY MOUTH FOR ANXIETY FOR 1 WEEK THEN TAKE 1 TABLET AS NEEDED FOR ANXIETY AND SITUATIONAL ANXIETY 03/03/21   Grayce Sessions, NP  triamcinolone ointment (KENALOG) 0.1 %  07/01/18   [provider]  Zinc 50 MG TABS Take 50 mg by mouth at bedtime.    [provider]      Allergies    Molds & smuts    Review of Systems   Review of Systems  Respiratory:  Positive for chest tightness and shortness of breath.   All other systems reviewed and are negative.   Physical Exam Updated Vital Signs BP (!) 145/95   Pulse 84   Temp 100.1 F (37.8 C) (Oral)   Resp 18   Ht 5\' 1"  (1.549 m)   Wt 99.8 kg   SpO2 100%   BMI 41.57 kg/m  Physical Exam Vitals and nursing note reviewed.  Constitutional:      Appearance: Normal appearance.  HENT:     Head: Normocephalic and atraumatic.  Eyes:     Conjunctiva/sclera: Conjunctivae normal.  Cardiovascular:     Rate and Rhythm: Normal rate and regular rhythm.  Pulmonary:     Effort: Pulmonary effort is normal. No respiratory distress.     Breath sounds: Normal breath sounds.  Abdominal:  General: There is no distension.     Palpations: Abdomen is soft.     Tenderness: There is no abdominal tenderness.  Skin:    General: Skin is warm and dry.  Neurological:     General: No focal deficit present.     Mental Status: She is alert.    ED Results / Procedures / Treatments   Labs (all labs ordered are listed, but only abnormal results are displayed) Labs Reviewed  RESP PANEL BY RT-PCR (RSV, FLU A&B, COVID)  [id number]    EKG EKG Interpretation Date/Time:  Thursday May 19 2023 22:32:33 EST Ventricular Rate:  90 PR Interval:  89 QRS Duration:  88 QT Interval:  354 QTC Calculation: 434 R Axis:   52  Text  Interpretation: Sinus rhythm Short PR interval Low voltage, precordial leads Confirmed by Vonita Moss [phone]) on 05/19/2023 11:28:46 PM  Radiology DG Chest 2 View Result Date: 05/19/2023 CLINICAL DATA:  Shortness of breath. EXAM: CHEST - 2 VIEW COMPARISON:  April 24, 2014 FINDINGS: The heart size and mediastinal contours are within normal limits. Both lungs are clear. The visualized skeletal structures are unremarkable. IMPRESSION: No active cardiopulmonary disease. Electronically Signed   By: Aram Candela M.D.   On: 05/19/2023 23:06    Procedures Procedures    Medications Ordered in ED Medications  ipratropium-albuterol (DUONEB) 0.5-2.5 (3) MG/3ML nebulizer solution 3 mL (3 mLs Nebulization Given 05/19/23 2256)  predniSONE (DELTASONE) tablet 60 mg (60 mg Oral Given 05/19/23 2240)    ED Course/ Medical Decision Making/ A&P                                 Medical Decision Making Risk Prescription drug management.  This patient is a 22 y.o. female  who presents to the ED for concern of shortness of breath.   Differential diagnoses prior to evaluation: The emergent differential diagnosis includes, but is not limited to,  CHF, pericardial effusion/tamponade, arrhythmias, ACS, COPD, asthma, bronchitis, pneumonia, pneumothorax, PE, anemia. This is not an exhaustive differential.   Past Medical History / Co-morbidities / Social History: Asthma  Additional history: Chart reviewed. Pertinent results include: No record of admission for asthma, very few ER visits for same, last in 2015  Physical Exam: Physical exam performed. The pertinent findings include: Mildly hypertensive, temperature 100.1 F, otherwise normal vital signs.  Normal respiratory effort, lung sounds clear.  Patient appears somewhat anxious, and is tearful.  Lab Tests/Imaging studies: I personally interpreted labs/imaging and the pertinent results include:  CXR without acute abnormalities. I agree with the  radiologist interpretation. Respiratory panel negative.  Cardiac monitoring: EKG obtained and interpreted by myself and attending physician which shows: sinus rhythm   Medications: I ordered medication including prednisone and duoneb.  I have reviewed the patients home medicines and have made adjustments as needed. Patient feeling improved after medications, on reevaluation her air movement is improved.    Disposition: After consideration of the diagnostic results and the patients response to treatment, I feel that emergency department workup does not suggest an emergent condition requiring admission or immediate intervention beyond what has been performed at this time. The plan is: discharge to home with ongoing management of asthma flare. Will send prednisone burst, and refilled all her asthma medications. Encouraged follow up with PCP, patient is requesting referral to pulmonology so I have sent this for her. The patient is safe for discharge and has been instructed to  return immediately for worsening symptoms, change in symptoms or any other concerns.  Final Clinical Impression(s) / ED Diagnoses Final diagnoses:  Moderate persistent asthma with acute exacerbation    Rx / DC Orders ED Discharge Orders          Ordered    budesonide (PULMICORT) 0.25 MG/2ML nebulizer solution        05/19/23 2325    budesonide-formoterol (SYMBICORT) 160-4.5 MCG/ACT inhaler  2 times daily        05/19/23 2325    montelukast (SINGULAIR) 10 MG tablet  Nightly        05/19/23 2325    albuterol (VENTOLIN HFA) 108 (90 Base) MCG/ACT inhaler  Every 4 hours PRN        05/19/23 2325    Ambulatory referral to Pulmonology        05/19/23 2327    predniSONE (DELTASONE) 20 MG tablet  Daily with breakfast        05/19/23 2330           Portions of this report may have been transcribed using voice recognition software. Every effort was made to ensure accuracy; however, inadvertent computerized transcription  errors may be present.    Jeanella Flattery 05/19/23 2331    Rondel Baton, MD 05/21/23 1331

## 2023-05-19 NOTE — ED Notes (Signed)
Patient transported to X-ray 

## 2023-05-19 NOTE — ED Triage Notes (Signed)
Pt coming in for asthma attack 1 hr ago. Pt took a couple puffs of albuterol inhaler at home, mild improvement but still reporting SOB. Pt also reporting chest pain/tightness happening throughout event, mildly less after albuterol.

## 2023-05-31 ENCOUNTER — Ambulatory Visit: Payer: Medicaid Other | Admitting: Nurse Practitioner

## 2023-06-14 ENCOUNTER — Ambulatory Visit: Payer: Medicaid Other | Admitting: Critical Care Medicine

## 2023-07-08 ENCOUNTER — Encounter: Payer: Self-pay | Admitting: Family

## 2023-07-08 ENCOUNTER — Ambulatory Visit (INDEPENDENT_AMBULATORY_CARE_PROVIDER_SITE_OTHER): Payer: Medicaid Other | Admitting: Family

## 2023-07-08 VITALS — BP 134/86 | HR 88 | Temp 97.3°F | Ht 61.0 in | Wt 218.2 lb

## 2023-07-08 DIAGNOSIS — F411 Generalized anxiety disorder: Secondary | ICD-10-CM | POA: Diagnosis not present

## 2023-07-08 DIAGNOSIS — F4010 Social phobia, unspecified: Secondary | ICD-10-CM | POA: Insufficient documentation

## 2023-07-08 DIAGNOSIS — Z0000 Encounter for general adult medical examination without abnormal findings: Secondary | ICD-10-CM | POA: Diagnosis not present

## 2023-07-08 DIAGNOSIS — R61 Generalized hyperhidrosis: Secondary | ICD-10-CM | POA: Diagnosis not present

## 2023-07-08 MED ORDER — PROPRANOLOL HCL 20 MG PO TABS: ORAL_TABLET | ORAL | 1 refills | Status: AC

## 2023-07-08 NOTE — Assessment & Plan Note (Signed)
Patient is on as-needed Propranolol 10mg  taking approximately once a week, sometimes daily -Continue Propranolol as needed. -Reduced quantity of pills per refill due to not needing as many -F/U in 6 mos or prn

## 2023-07-08 NOTE — Patient Instructions (Signed)
It was very nice to see you today!   I will review your lab results via MyChart in a few days.   Have a great weekend!    PLEASE NOTE:  If you had any lab tests please let us know if you have not heard back within a few days. You may see your results on MyChart before we have a chance to review them but we will give you a call once they are reviewed by us. If we ordered any referrals today, please let us know if you have not heard from their office within the next week.    

## 2023-07-08 NOTE — Progress Notes (Signed)
Phone [phone]  Subjective:   Patient is a 23 y.o. female presenting for annual physical.    Chief Complaint  Patient presents with   Annual Exam    Non fasting w/ labs    Anxiety   Discussed the use of AI scribe software for clinical note transcription with the patient, who gave verbal consent to proceed.  History of Present Illness   The patient presents for an annual physical exam. She ate a small piece of chicken and some lettuce before the appointment but she did not have a full meal. She has a history of hyperhidrosis, primarily affecting her hands, which have been sweaty since childhood. Her hands are often sweaty even when cold, and the condition worsens in the summer. She has not tried any treatments for this condition. Occasionally, her feet are also involved. She is continuing to use propranolol as needed, sometimes as infrequently as once a week for anxiety. She is currently taking a break from school and is working full-time as a Journalist, newspaper. She is not using any birth control and reports that her menstrual cycles are regular.     See problem oriented charting- ROS- full  review of systems was completed and negative except for what is noted in HPI above.   The following were reviewed and entered/updated in epic: Past Medical History:  Diagnosis Date   Allergy    Anxiety    Asthma    High cholesterol    Patient Active Problem List   Diagnosis Date Noted   Morbid obesity (HCC) 12/27/2022   Abnormal weight gain 12/27/2022   Generalized anxiety disorder 05/12/2022   Mild intermittent asthma without complication 02/22/2022   Past Surgical History:  Procedure Laterality Date   WISDOM TOOTH EXTRACTION      Family History  Problem Relation Age of Onset   Heart disease Father    Hyperlipidemia Father    Hypertension Father    Arthritis Maternal Grandmother    Birth defects Maternal Grandfather    Early death Maternal Grandfather     Medications-  reviewed and updated Current Outpatient Medications  Medication Sig Dispense Refill   albuterol (VENTOLIN HFA) 108 (90 Base) MCG/ACT inhaler Inhale 2 puffs into the lungs every 4 (four) hours as needed for wheezing or shortness of breath. 54 g 1   budesonide (PULMICORT) 0.25 MG/2ML nebulizer solution USE 2 ML VIA NEBULIZER TWICE DAILY AS NEEDED 60 mL 7   budesonide-formoterol (SYMBICORT) 160-4.5 MCG/ACT inhaler Inhale 2 puffs into the lungs in the morning and at bedtime. 30.6 g 0   cetirizine (ZYRTEC) 10 MG tablet Take by mouth.     Cholecalciferol (VITAMIN D3) 50 MCG (2000 UT) TABS Take 1 tablet by mouth at bedtime.     MAGNESIUM PO Take by mouth.     montelukast (SINGULAIR) 10 MG tablet Take 1 tablet (10 mg total) by mouth at bedtime. 90 tablet 1   Zinc 50 MG TABS Take 50 mg by mouth at bedtime.     propranolol (INDERAL) 20 MG tablet TAKE 1/2 TABLET BY MOUTH FOR ANXIETY FOR 1 WEEK THEN TAKE 1 TABLET AS NEEDED FOR ANXIETY AND SITUATIONAL ANXIETY 60 tablet 1   triamcinolone ointment (KENALOG) 0.1 %  (Patient not taking: Reported on 07/08/2023)     No current facility-administered medications for this visit.   Allergies-reviewed and updated Allergies  Allergen Reactions   Molds & Smuts Shortness Of Breath    Social History   Social History Narrative  pt is a full time student at [employer], interested in pre-med   working part time    Objective:  BP 134/86 (BP Location: Left Arm, Patient Position: Sitting, Cuff Size: Large)   Pulse 88   Temp (!) 97.3 F (36.3 C) (Temporal)   Ht 5\' 1"  (1.549 m)   Wt 218 lb 3.2 oz (99 kg)   SpO2 98%   BMI 41.23 kg/m  Physical Exam Vitals and nursing note reviewed.  Constitutional:      Appearance: Normal appearance. She is obese.  HENT:     Head: Normocephalic.     Right Ear: Tympanic membrane normal.     Left Ear: Tympanic membrane normal.     Nose: Nose normal.     Mouth/Throat:     Mouth: Mucous membranes are moist.  Eyes:      Pupils: Pupils are equal, round, and reactive to light.  Cardiovascular:     Rate and Rhythm: Normal rate and regular rhythm.  Pulmonary:     Effort: Pulmonary effort is normal.     Breath sounds: Normal breath sounds.  Musculoskeletal:        General: Normal range of motion.     Cervical back: Normal range of motion.  Lymphadenopathy:     Cervical: No cervical adenopathy.  Skin:    General: Skin is warm and dry.     Comments: notable excessive sweat on bilateral hands  Neurological:     Mental Status: She is alert.  Psychiatric:        Mood and Affect: Mood normal.        Behavior: Behavior normal.      Assessment and Plan  Health Maintenance counseling: 1. Anticipatory guidance: Patient counseled regarding regular dental exams q6 months, eye exams,  avoiding smoking and second hand smoke, limiting alcohol to 1 beverage per day, no illicit drugs.   2. Risk factor reduction:  Advised patient of need for regular exercise and diet rich with fruits and vegetables to reduce risk of heart attack and stroke. Wt Readings from Last 3 Encounters:  07/08/23 218 lb 3.2 oz (99 kg)  05/19/23 220 lb (99.8 kg)  12/27/22 220 lb (99.8 kg)   3. Immunizations/screenings/ancillary studies Immunization History  Administered Date(s) Administered   DTaP 10/23/2001, 12/29/2001, 02/16/2002, 01/22/2003, 09/09/2006   HIB (PRP-OMP) 10/23/2001, 12/29/2001, 02/16/2002, 01/22/2003   Hep A, Unspecified 09/09/2006, 07/17/2007   Hepatitis A 09/09/2006, 07/17/2007   Hepatitis B 2001/10/05, 09/21/2001, 05/21/2002   Hepatitis B, PED/ADOLESCENT December 04, 2001, 09/21/2001, 05/21/2002   Hpv-Unspecified 02/09/2013, 02/14/2015   IPV 10/23/2001, 12/29/2001, 05/21/2002, 09/09/2006   Influenza,inj,Quad PF,6+ Mos 07/05/2018, 02/28/2020, 03/03/2021, 02/22/2022   MMR 08/20/2002, 09/09/2006   Td 02/09/2013   Td (Adult),5 Lf Tetanus Toxid, Preservative Free 02/09/2013   Tdap 02/09/2013   Varicella 08/20/2002, 09/09/2006    There are no preventive care reminders to display for this patient.   4. Cervical cancer screening: last done 12/2022 5. Skin cancer screening- advised regular sunscreen use. Denies worrisome, changing, or new skin lesions.  6. Birth control/STD check: none 7. Smoking associated screening: non- smoker 8. Alcohol screening: none  Assessment and Plan    Hyperhidrosis - Patient reports excessive sweating in hands and feet, worse in summer. No other areas affected. -Recommended trial of over-the-counter Dri antiperspirant. -F/U prn  Anxiety - Patient is on as-needed Propranolol, taking approximately once a week. -Continue Propranolol as needed. -Reduced quantity of pills per refill due to patient's request. -F/U in 6 mos  General Health Maintenance - -Advised patient to get flu and Tdap vaccines at her pharmacy. -Ordering lipids, CMP, & CBC w/diff -Continue regular dental hygiene, emphasizing importance of oral microbiome. -Plan to review lab results next week and will send patient a message with results.      Recommended follow up:  No follow-ups on file. Future Appointments  Date Time Provider Department Center  08/30/2023  8:30 AM Martina Sinner, MD [facility] None    Lab/Order associations: non- fasting    Dulce Sellar, NP

## 2023-07-09 ENCOUNTER — Other Ambulatory Visit: Payer: Self-pay | Admitting: Family

## 2023-07-09 DIAGNOSIS — F411 Generalized anxiety disorder: Secondary | ICD-10-CM

## 2023-07-09 LAB — COMPLETE METABOLIC PANEL WITH GFR
AG Ratio: 1.7 (calc) (ref 1.0–2.5)
ALT: 18 U/L (ref 6–29)
AST: 12 U/L (ref 10–30)
Albumin: 4.5 g/dL (ref 3.6–5.1)
Alkaline phosphatase (APISO): 52 U/L (ref 31–125)
BUN: 8 mg/dL (ref 7–25)
CO2: 22 mmol/L (ref 20–32)
Calcium: 9.6 mg/dL (ref 8.6–10.2)
Chloride: 103 mmol/L (ref 98–110)
Creat: 0.74 mg/dL (ref 0.50–0.96)
Globulin: 2.6 g/dL (ref 1.9–3.7)
Glucose, Bld: 76 mg/dL (ref 65–99)
Potassium: 4.2 mmol/L (ref 3.5–5.3)
Sodium: 137 mmol/L (ref 135–146)
Total Bilirubin: 0.5 mg/dL (ref 0.2–1.2)
Total Protein: 7.1 g/dL (ref 6.1–8.1)
eGFR: 118 mL/min/{1.73_m2} (ref >=60)

## 2023-07-09 LAB — CBC WITH DIFFERENTIAL/PLATELET
Absolute Lymphocytes: 2475 {cells}/uL (ref 850–3900)
Absolute Monocytes: 790 {cells}/uL (ref 200–950)
Basophils Absolute: 42 {cells}/uL (ref 0–200)
Basophils Relative: 0.4 %
Eosinophils Absolute: 312 {cells}/uL (ref 15–500)
Eosinophils Relative: 3 %
HCT: 40 % (ref 35.0–45.0)
Hemoglobin: 13 g/dL (ref 11.7–15.5)
MCH: 29.2 pg (ref 27.0–33.0)
MCHC: 32.5 g/dL (ref 32.0–36.0)
MCV: 89.9 fL (ref 80.0–100.0)
MPV: 11.4 fL (ref 7.5–12.5)
Monocytes Relative: 7.6 %
Neutro Abs: 6781 {cells}/uL (ref 1500–7800)
Neutrophils Relative %: 65.2 %
Platelets: 309 10*3/uL (ref 140–400)
RBC: 4.45 10*6/uL (ref 3.80–5.10)
RDW: 12.4 % (ref 11.0–15.0)
Total Lymphocyte: 23.8 %
WBC: 10.4 10*3/uL (ref 3.8–10.8)

## 2023-07-09 LAB — LIPID PANEL
Cholesterol: 192 mg/dL (ref <200)
HDL: 40 mg/dL — ABNORMAL LOW (ref >=50)
LDL Cholesterol (Calc): 132 mg/dL — ABNORMAL HIGH
Non-HDL Cholesterol (Calc): 152 mg/dL — ABNORMAL HIGH (ref <130)
Total CHOL/HDL Ratio: 4.8 (calc) (ref <5.0)
Triglycerides: 92 mg/dL (ref <150)

## 2023-07-10 ENCOUNTER — Encounter: Payer: Self-pay | Admitting: Family

## 2023-08-30 ENCOUNTER — Ambulatory Visit: Payer: Medicaid Other | Admitting: Pulmonary Disease

## 2023-08-30 ENCOUNTER — Encounter: Payer: Self-pay | Admitting: Pulmonary Disease

## 2023-08-30 VITALS — BP 128/84 | HR 82 | Ht 61.0 in | Wt 204.2 lb

## 2023-08-30 DIAGNOSIS — F410 Panic disorder [episodic paroxysmal anxiety] without agoraphobia: Secondary | ICD-10-CM

## 2023-08-30 DIAGNOSIS — J4540 Moderate persistent asthma, uncomplicated: Secondary | ICD-10-CM

## 2023-08-30 MED ORDER — BREZTRI AEROSPHERE 160-9-4.8 MCG/ACT IN AERO: INHALATION_SPRAY | RESPIRATORY_TRACT | 0 refills | Status: DC

## 2023-08-30 NOTE — Progress Notes (Signed)
 Synopsis: Referred in March 2025 for Asthma  Subjective:   PATIENT ID: Blenda Mounts GENDER: female DOB: 2001/03/19, MRN: [id number]   HPI  Chief Complaint  Patient presents with   Consult    Pt states she had asthma as child elementary school and had to go to Ed not too long ago paid it more likely of a panic attack.    Zaylie Gisler is a 23 year old female with asthma who presents with recent exacerbations and difficulty differentiating from anxiety attacks.  She has been experiencing recent exacerbations of asthma, characterized by shortness of breath, wheezing, and chest tightness. There is difficulty differentiating between asthma attacks and anxiety attacks due to overlapping symptoms such as shortness of breath and a sensation of throat closing. She was diagnosed with asthma in elementary school but has not had consistent follow-up care over the years. Her last use of prednisone was during an emergency room visit at the end of last year.  Current asthma management includes budesonide via nebulizer, Symbicort, montelukast, and Ventolin. She uses Symbicort more frequently than prescribed, sometimes up to eight times a day on bad days, due to worsening symptoms. No recent nighttime awakenings due to asthma symptoms. No waking up with chest tightness, shortness of breath, or wheezing. Cold air, smoke, strong perfumes, and colognes exacerbate her symptoms.  She has a history of anxiety and panic attacks since elementary school, which became more consistent over the past four years. Recently, she experiences panic attacks a couple of times a week. She attributes some improvement to recent weight loss over the past two months. She has been prescribed propranolol for anxiety but uses it infrequently, having taken it only three times in the last six months.  She has a history of seasonal allergies, particularly in the summer, and reports that cold air, smoke, strong perfumes, and colognes  exacerbate her symptoms. She takes Zyrtec and off-brand Claritin for allergy management. She denies current smoking or vaping but was exposed to secondhand smoke during childhood. Her mother reportedly had asthma when she was younger.  Past Medical History:  Diagnosis Date   Allergy    Anxiety    Asthma    High cholesterol      Family History  Problem Relation Age of Onset   Heart disease Father    Hyperlipidemia Father    Hypertension Father    Arthritis Maternal Grandmother    Birth defects Maternal Grandfather    Early death Maternal Grandfather      Social History   Socioeconomic History   Marital status: Single    Spouse name: Not on file   Number of children: Not on file   Years of education: Not on file   Highest education level: Some college, no degree  Occupational History   Not on file  Tobacco Use   Smoking status: Never   Smokeless tobacco: Never  Vaping Use   Vaping status: Never Used  Substance and Sexual Activity   Alcohol use: Never   Drug use: Never   Sexual activity: Yes    Birth control/protection: None  Other Topics Concern   Not on file  Social History Narrative   pt is a full time Consulting civil engineer at [employer], interested in pre-med   working part time   Social Drivers of Corporate investment banker Strain: Medium Risk (07/07/2023)   Overall Financial Resource Strain (CARDIA)    Difficulty of Paying Living Expenses: Somewhat hard  Food Insecurity: Food Insecurity  Present (07/07/2023)   Hunger Vital Sign    Worried About Running Out of Food in the Last Year: Sometimes true    Ran Out of Food in the Last Year: Patient declined  Transportation Needs: No Transportation Needs (07/07/2023)   PRAPARE - Administrator, Civil Service (Medical): No    Lack of Transportation (Non-Medical): No  Physical Activity: Insufficiently Active (07/07/2023)   Exercise Vital Sign    Days of Exercise per Week: 3 days    Minutes of Exercise per Session:  30 min  Stress: Stress Concern Present (07/07/2023)   [employer] of Occupational Health - Occupational Stress Questionnaire    Feeling of Stress : Rather much  Social Connections: Socially Isolated (07/07/2023)   Social Connection and Isolation Panel [NHANES]    Frequency of Communication with Friends and Family: Once a week    Frequency of Social Gatherings with Friends and Family: Once a week    Attends Religious Services: Never    Database administrator or Organizations: No    Attends Engineer, structural: Not on file    Marital Status: Never married  Catering manager Violence: Not on file     Allergies  Allergen Reactions   Molds & Smuts Shortness Of Breath     Outpatient Medications Prior to Visit  Medication Sig Dispense Refill   albuterol (VENTOLIN HFA) 108 (90 Base) MCG/ACT inhaler Inhale 2 puffs into the lungs every 4 (four) hours as needed for wheezing or shortness of breath. 54 g 1   budesonide (PULMICORT) 0.25 MG/2ML nebulizer solution USE 2 ML VIA NEBULIZER TWICE DAILY AS NEEDED 60 mL 7   budesonide-formoterol (SYMBICORT) 160-4.5 MCG/ACT inhaler Inhale 2 puffs into the lungs in the morning and at bedtime. 30.6 g 0   cetirizine (ZYRTEC) 10 MG tablet Take by mouth.     Cholecalciferol (VITAMIN D3) 50 MCG (2000 UT) TABS Take 1 tablet by mouth at bedtime.     MAGNESIUM PO Take by mouth.     montelukast (SINGULAIR) 10 MG tablet Take 1 tablet (10 mg total) by mouth at bedtime. 90 tablet 1   propranolol (INDERAL) 20 MG tablet TAKE 1/2 TABLET BY MOUTH FOR ANXIETY FOR 1 WEEK THEN TAKE 1 TABLET AS NEEDED FOR ANXIETY AND SITUATIONAL ANXIETY 60 tablet 1   triamcinolone ointment (KENALOG) 0.1 %      Zinc 50 MG TABS Take 50 mg by mouth at bedtime.     No facility-administered medications prior to visit.   Review of Systems  Constitutional:  Negative for chills, fever, malaise/fatigue and weight loss.  HENT:  Negative for congestion, sinus pain and sore throat.    Eyes: Negative.   Respiratory:  Positive for shortness of breath and wheezing. Negative for cough, hemoptysis and sputum production.   Cardiovascular:  Positive for chest pain (tightness). Negative for palpitations, orthopnea, claudication and leg swelling.  Gastrointestinal:  Negative for abdominal pain, heartburn, nausea and vomiting.  Genitourinary: Negative.   Musculoskeletal:  Negative for joint pain and myalgias.  Skin:  Negative for rash.  Neurological:  Negative for weakness.  Endo/Heme/Allergies: Negative.   Psychiatric/Behavioral:  The patient is nervous/anxious.     Objective:   Vitals:   08/30/23 0830  BP: 128/84  Pulse: 82  SpO2: 99%  Weight: 204 lb 3.2 oz (92.6 kg)  Height: 5\' 1"  (1.549 m)    Physical Exam Constitutional:      General: She is not in acute distress.  Appearance: Normal appearance.  Eyes:     General: No scleral icterus.    Conjunctiva/sclera: Conjunctivae normal.  Cardiovascular:     Rate and Rhythm: Normal rate and regular rhythm.  Pulmonary:     Breath sounds: No wheezing, rhonchi or rales.  Musculoskeletal:     Right lower leg: No edema.     Left lower leg: No edema.  Skin:    General: Skin is warm and dry.  Neurological:     General: No focal deficit present.    CBC    Component Value Date/Time   WBC 10.4 07/08/2023 1517   RBC 4.45 07/08/2023 1517   HGB 13.0 07/08/2023 1517   HGB 12.7 02/28/2020 0952   HCT 40.0 07/08/2023 1517   HCT 37.2 02/28/2020 0952   PLT 309 07/08/2023 1517   PLT 320 02/28/2020 0952   MCV 89.9 07/08/2023 1517   MCV 89 02/28/2020 0952   MCH 29.2 07/08/2023 1517   MCHC 32.5 07/08/2023 1517   RDW 12.4 07/08/2023 1517   RDW 12.3 02/28/2020 0952   LYMPHSABS 1.9 05/25/2022 0951   LYMPHSABS 2.0 02/28/2020 0952   MONOABS 0.6 05/25/2022 0951   EOSABS 312 07/08/2023 1517   EOSABS 0.5 (H) 02/28/2020 0952   BASOSABS 42 07/08/2023 1517   BASOSABS 0.0 02/28/2020 0952      Latest Ref Rng & Units  07/08/2023    3:17 PM 05/25/2022    9:51 AM 02/28/2020    9:52 AM  BMP  Glucose 65 - 99 mg/dL 76  83  88   BUN 7 - 25 mg/dL 8  12  8    Creatinine 0.50 - 0.96 mg/dL 1.61  0.96  0.45   BUN/Creat Ratio 6 - 22 (calc) SEE NOTE:   14   Sodium 135 - 146 mmol/L 137  139  139   Potassium 3.5 - 5.3 mmol/L 4.2  4.4  4.1   Chloride 98 - 110 mmol/L 103  102  102   CO2 20 - 32 mmol/L 22  28  23    Calcium 8.6 - 10.2 mg/dL 9.6  9.5  9.5    Chest imaging: CXR 05/19/23 The heart size and mediastinal contours are within normal limits. Both lungs are clear. The visualized skeletal structures are unremarkable.  PFT:     No data to display          Labs: Absolute Eosinophil count 312 on 07/08/23  Path:  Echo:  Heart Catheterization:    Assessment & Plan:   Moderate persistent asthma without complication - Plan: Pulmonary Function Test, Resp Allergy Profile Regn2DC [place] MD [facility]  Discussion: Danya Spearman is a 23 year old female with asthma who presents with recent exacerbations and difficulty differentiating from anxiety attacks.  Asthma Asthma with recent exacerbations and overlapping anxiety symptoms. Excessive Symbicort use may contribute to anxiety. Mild allergic inflammation indicated by eosinophil count. Weight loss potentially improving symptoms. Proposed Breztri for additional bronchodilation. Biologic therapy considered if Breztri ineffective. - Discontinue Symbicort, start Breztri, two puffs morning and evening. - She is to message [place] if she notices benefit and wants to stay on breztri - Continue montelukast and Zyrtec. - Use albuterol as needed, max every four hours. - Order allergy panel and schedule breathing tests. - Consider Dupixent if allergy panel indicate need.  Anxiety with Panic Attacks Chronic anxiety with panic attacks complicating asthma management. Propranolol underutilized. Weight loss reduced panic frequency. Asthma symptoms may trigger panic attacks.  -  Consider Atarax  for anxiety management. - Discuss long-term anxiety management with primary care or mental health professional.  Follow up in 3 months  Melody Comas, MD [facility] Office: [phone]    Current Outpatient Medications:    albuterol (VENTOLIN HFA) 108 (90 Base) MCG/ACT inhaler, Inhale 2 puffs into the lungs every 4 (four) hours as needed for wheezing or shortness of breath., Disp: 54 g, Rfl: 1   budesonide (PULMICORT) 0.25 MG/2ML nebulizer solution, USE 2 ML VIA NEBULIZER TWICE DAILY AS NEEDED, Disp: 60 mL, Rfl: 7   budesonide-formoterol (SYMBICORT) 160-4.5 MCG/ACT inhaler, Inhale 2 puffs into the lungs in the morning and at bedtime., Disp: 30.6 g, Rfl: 0   cetirizine (ZYRTEC) 10 MG tablet, Take by mouth., Disp: , Rfl:    Cholecalciferol (VITAMIN D3) 50 MCG (2000 UT) TABS, Take 1 tablet by mouth at bedtime., Disp: , Rfl:    MAGNESIUM PO, Take by mouth., Disp: , Rfl:    montelukast (SINGULAIR) 10 MG tablet, Take 1 tablet (10 mg total) by mouth at bedtime., Disp: 90 tablet, Rfl: 1   propranolol (INDERAL) 20 MG tablet, TAKE 1/2 TABLET BY MOUTH FOR ANXIETY FOR 1 WEEK THEN TAKE 1 TABLET AS NEEDED FOR ANXIETY AND SITUATIONAL ANXIETY, Disp: 60 tablet, Rfl: 1   triamcinolone ointment (KENALOG) 0.1 %, , Disp: , Rfl:    Zinc 50 MG TABS, Take 50 mg by mouth at bedtime., Disp: , Rfl:

## 2023-08-30 NOTE — Addendum Note (Signed)
 Addended by: Girtha Hake on: 08/30/2023 04:25 PM   Modules accepted: Orders

## 2023-08-30 NOTE — Patient Instructions (Addendum)
 Try Breztri inhaler 2 puffs twice daily - rinse mouth out after each use  Stop symbicort while on breztri  Use albuterol inhaler 1-2 puffs every 4-6 hours as needed  Continue montelukast 10mg  daily  Continue zyrtec daily for allergies  Will check allergy panel today  We will schedule you for pulmonary function tests at earliest possible date, schedule at front desk  If your symptoms continue despite maximal inhaler therapy we will consider starting you on dupixent  Follow up in 3 months, call sooner if needed

## 2023-09-02 LAB — RESPIRATORY ALLERGY PROFILE REGION II ~~LOC~~
Allergen, A. alternata, m6: 0.4 kU/L — ABNORMAL HIGH
Allergen, Cedar tree, t12: 1.81 kU/L — ABNORMAL HIGH
Allergen, Comm Silver Birch, t9: 0.26 kU/L — ABNORMAL HIGH
Allergen, Cottonwood, t14: 0.17 kU/L — ABNORMAL HIGH
Allergen, D pternoyssinus,d7: 10.7 kU/L — ABNORMAL HIGH
Allergen, Mouse Urine Protein, e78: <0.1 kU/L
Allergen, Mulberry, t76: <0.1 kU/L
Allergen, Oak,t7: 0.3 kU/L — ABNORMAL HIGH
Allergen, P. notatum, m1: 1.52 kU/L — ABNORMAL HIGH
Aspergillus fumigatus, m3: 0.89 kU/L — ABNORMAL HIGH
Bermuda Grass: 0.32 kU/L — ABNORMAL HIGH
Box Elder IgE: 0.15 kU/L — ABNORMAL HIGH
CLADOSPORIUM HERBARUM (M2) IGE: 0.68 kU/L — ABNORMAL HIGH
COMMON RAGWEED (SHORT) (W1) IGE: 0.14 kU/L — ABNORMAL HIGH
Cat Dander: 1.99 kU/L — ABNORMAL HIGH
Class: 0
Class: 0
Class: 1
Class: 1
Class: 2
Class: 2
Class: 2
Class: 2
Class: 2
Class: 2
Class: 2
Class: 3
Class: 3
Class: 3
Cockroach: 1.34 kU/L — ABNORMAL HIGH
D. farinae: 5.31 kU/L — ABNORMAL HIGH
Dog Dander: 14.7 kU/L — ABNORMAL HIGH
Elm IgE: 0.26 kU/L — ABNORMAL HIGH
IgE (Immunoglobulin E), Serum: 978 kU/L — ABNORMAL HIGH (ref <=114)
Johnson Grass: 0.28 kU/L — ABNORMAL HIGH
Pecan/Hickory Tree IgE: 2.27 kU/L — ABNORMAL HIGH
Rough Pigweed  IgE: 0.23 kU/L — ABNORMAL HIGH
Sheep Sorrel IgE: 0.13 kU/L — ABNORMAL HIGH
Timothy Grass: 1.64 kU/L — ABNORMAL HIGH

## 2023-09-02 LAB — CAT DANDER COMPONENT
E220-IgE Fel d 2: 0.22 kU/L — ABNORMAL HIGH (ref <0.10)
E228-IgE Fel d 4: 0.18 kU/L — ABNORMAL HIGH (ref <0.10)
Fel d 1 (e94) IgE: 0.93 kU/L — ABNORMAL HIGH (ref <0.10)
Fel d 7 (e231) IgE: 0.1 kU/L — ABNORMAL HIGH (ref <0.10)

## 2023-09-02 LAB — DOG DANDER COMPONENT
Can f 4(e229) IgE: 1.97 kU/L — ABNORMAL HIGH (ref <0.10)
Can f 6(e230) IgE: 0.36 kU/L — ABNORMAL HIGH (ref <0.10)
E101-IgE Can f 1: 0.43 kU/L — ABNORMAL HIGH (ref <0.10)
E102-IgE Can f 2: 0.56 kU/L — ABNORMAL HIGH (ref <0.10)
E221-IgE Can f 3: 0.29 kU/L — ABNORMAL HIGH (ref <0.10)
E226-IgE Can f 5: <0.1 kU/L (ref <0.10)

## 2023-09-02 LAB — INTERPRETATION:

## 2023-09-05 ENCOUNTER — Encounter: Payer: Self-pay | Admitting: Emergency Medicine

## 2023-09-05 ENCOUNTER — Ambulatory Visit
Admission: EM | Admit: 2023-09-05 | Discharge: 2023-09-05 | Disposition: A | Discharge status: Home / Self Care | Attending: Family Medicine | Admitting: Family Medicine

## 2023-09-05 ENCOUNTER — Other Ambulatory Visit: Payer: Self-pay

## 2023-09-05 DIAGNOSIS — Z113 Encounter for screening for infections with a predominantly sexual mode of transmission: Secondary | ICD-10-CM

## 2023-09-05 LAB — POCT URINE PREGNANCY: Preg Test, Ur: NEGATIVE

## 2023-09-05 NOTE — ED Provider Notes (Signed)
 [facility]    CSN: [id number] Arrival date & time: 09/05/23  1142      History   Chief Complaint No chief complaint on file.   HPI Mary Rose is a 23 y.o. female.   Patient is here for STD screening.  No known exposures.  No symptoms.  She has a new partner.  She would like blood work and vaginal swab.        Past Medical History:  Diagnosis Date   Allergy    Anxiety    Asthma    High cholesterol     Patient Active Problem List   Diagnosis Date Noted   Morbid obesity (HCC) 12/27/2022   Abnormal weight gain 12/27/2022   Generalized anxiety disorder 05/12/2022   Mild intermittent asthma without complication 02/22/2022    Past Surgical History:  Procedure Laterality Date   WISDOM TOOTH EXTRACTION      OB History   No obstetric history on file.      Home Medications    Prior to Admission medications   Medication Sig Start Date End Date Taking? Authorizing Provider  albuterol (VENTOLIN HFA) 108 (90 Base) MCG/ACT inhaler Inhale 2 puffs into the lungs every 4 (four) hours as needed for wheezing or shortness of breath. 05/19/23   Roemhildt, Lorin T, PA-C  budeson-glycopyrrolate-formoterol (BREZTRI AEROSPHERE) 160-9-4.8 MCG/ACT AERO 4 samples 08/30/23   Martina Sinner, MD  budesonide (PULMICORT) 0.25 MG/2ML nebulizer solution USE 2 ML VIA NEBULIZER TWICE DAILY AS NEEDED 05/19/23   Roemhildt, Lorin T, PA-C  budesonide-formoterol (SYMBICORT) 160-4.5 MCG/ACT inhaler Inhale 2 puffs into the lungs in the morning and at bedtime. 05/19/23   Roemhildt, Lorin T, PA-C  cetirizine (ZYRTEC) 10 MG tablet Take by mouth.    [provider]  Cholecalciferol (VITAMIN D3) 50 MCG (2000 UT) TABS Take 1 tablet by mouth at bedtime.    [provider]  MAGNESIUM PO Take by mouth.    [provider]  montelukast (SINGULAIR) 10 MG tablet Take 1 tablet (10 mg total) by mouth at bedtime. 05/19/23   Roemhildt, Lorin T, PA-C  propranolol  (INDERAL) 20 MG tablet TAKE 1/2 TABLET BY MOUTH FOR ANXIETY FOR 1 WEEK THEN TAKE 1 TABLET AS NEEDED FOR ANXIETY AND SITUATIONAL ANXIETY 07/08/23   Dulce Sellar, NP  triamcinolone ointment (KENALOG) 0.1 %  07/01/18   [provider]  Zinc 50 MG TABS Take 50 mg by mouth at bedtime.    [provider]    Family History Family History  Problem Relation Age of Onset   Heart disease Father    Hyperlipidemia Father    Hypertension Father    Arthritis Maternal Grandmother    Birth defects Maternal Grandfather    Early death Maternal Grandfather     Social History Social History   Tobacco Use   Smoking status: Never   Smokeless tobacco: Never  Vaping Use   Vaping status: Never Used  Substance Use Topics   Alcohol use: Never   Drug use: Never     Allergies   Molds & smuts   Review of Systems Review of Systems  Constitutional: Negative.   HENT: Negative.    Respiratory: Negative.    Gastrointestinal: Negative.   Musculoskeletal: Negative.      Physical Exam Triage Vital Signs ED Triage Vitals  Encounter Vitals Group     BP 09/05/23 1153 (!) 131/92     Systolic BP Percentile --      Diastolic BP  Percentile --      Pulse Rate 09/05/23 1153 75     Resp 09/05/23 1153 16     Temp 09/05/23 1153 98.6 F (37 C)     Temp Source 09/05/23 1153 Oral     SpO2 09/05/23 1153 98 %     Weight --      Height --      Head Circumference --      Peak Flow --      Pain Score 09/05/23 1152 0     Pain Loc --      Pain Education --      Exclude from Growth Chart --    No data found.  Updated Vital Signs BP (!) 131/92 (BP Location: Left Arm)   Pulse 75   Temp 98.6 F (37 C) (Oral)   Resp 16   LMP 08/11/2023 (Approximate)   SpO2 98%   Visual Acuity Right Eye Distance:   Left Eye Distance:   Bilateral Distance:    Right Eye Near:   Left Eye Near:    Bilateral Near:     Physical Exam Constitutional:      General: She is not in acute distress.     Appearance: Normal appearance. She is normal weight. She is not ill-appearing or toxic-appearing.  Cardiovascular:     Rate and Rhythm: Normal rate and regular rhythm.  Pulmonary:     Effort: Pulmonary effort is normal.     Breath sounds: Normal breath sounds.  Neurological:     General: No focal deficit present.     Mental Status: She is alert.  Psychiatric:        Mood and Affect: Mood normal.      UC Treatments / Results  Labs (all labs ordered are listed, but only abnormal results are displayed) Labs Reviewed  RPR  HIV ANTIBODY (ROUTINE TESTING W REFLEX)  POCT URINE PREGNANCY  CERVICOVAGINAL ANCILLARY ONLY   UPT negative  EKG   Radiology No results found.  Procedures Procedures (including critical care time)  Medications Ordered in UC Medications - No data to display  Initial Impression / Assessment and Plan / UC Course  I have reviewed the triage vital signs and the nursing notes.  Pertinent labs & imaging results that were available during my care of the patient were reviewed by me and considered in my medical decision making (see chart for details).   Final Clinical Impressions(s) / UC Diagnoses   Final diagnoses:  Screening for STD (sexually transmitted disease)     Discharge Instructions      You were seen today for STD testing.  Your blood work and vaginal swab will be resulted tomorrow.  You will see results on mychart and you will be notified if anything is positive for treatment.  Your pregnancy test was negative as well.     ED Prescriptions   None    PDMP not reviewed this encounter.   Jannifer Franklin, MD 09/05/23 [phone]

## 2023-09-05 NOTE — Discharge Instructions (Addendum)
 You were seen today for STD testing.  Your blood work and vaginal swab will be resulted tomorrow.  You will see results on mychart and you will be notified if anything is positive for treatment.  Your pregnancy test was negative as well.

## 2023-09-05 NOTE — ED Triage Notes (Signed)
 Pt requests STD checks d/t having new partner

## 2023-09-06 ENCOUNTER — Telehealth: Payer: Self-pay

## 2023-09-06 LAB — CERVICOVAGINAL ANCILLARY ONLY
Bacterial Vaginitis (gardnerella): POSITIVE — AB
Candida Glabrata: NEGATIVE
Candida Vaginitis: POSITIVE — AB
Chlamydia: NEGATIVE
Comment: NEGATIVE
Comment: NEGATIVE
Comment: NEGATIVE
Comment: NEGATIVE
Comment: NEGATIVE
Comment: NORMAL
Neisseria Gonorrhea: NEGATIVE
Trichomonas: NEGATIVE

## 2023-09-06 LAB — HIV ANTIBODY (ROUTINE TESTING W REFLEX): HIV Screen 4th Generation wRfx: NONREACTIVE

## 2023-09-06 LAB — RPR: RPR Ser Ql: NONREACTIVE

## 2023-09-06 MED ORDER — FLUCONAZOLE 150 MG PO TABS: 150.0000 mg | ORAL_TABLET | Freq: Once | ORAL | 0 refills | Status: AC

## 2023-09-06 NOTE — Telephone Encounter (Signed)
 Per protocol, pt requires tx with Diflucan.  Rx sent to pharmacy on file.

## 2023-09-19 ENCOUNTER — Ambulatory Visit (HOSPITAL_BASED_OUTPATIENT_CLINIC_OR_DEPARTMENT_OTHER): Admitting: Pulmonary Disease

## 2023-09-19 DIAGNOSIS — J4540 Moderate persistent asthma, uncomplicated: Secondary | ICD-10-CM | POA: Diagnosis not present

## 2023-09-19 LAB — PULMONARY FUNCTION TEST
DL/VA % pred: 112 %
DL/VA: 5.43 ml/min/mmHg/L
DLCO unc % pred: 123 %
DLCO unc: 24.73 ml/min/mmHg
FEF 25-75 Post: 4.49 L/s
FEF 25-75 Pre: 4.99 L/s
FEF2575-%Change-Post: -9 %
FEF2575-%Pred-Post: 127 %
FEF2575-%Pred-Pre: 141 %
FEV1-%Change-Post: -1 %
FEV1-%Pred-Post: 115 %
FEV1-%Pred-Pre: 116 %
FEV1-Post: 3.47 L
FEV1-Pre: 3.52 L
FEV1FVC-%Change-Post: -1 %
FEV1FVC-%Pred-Pre: 105 %
FEV6-%Change-Post: 0 %
FEV6-%Pred-Post: 112 %
FEV6-%Pred-Pre: 112 %
FEV6-Post: 3.86 L
FEV6-Pre: 3.87 L
FEV6FVC-%Pred-Post: 99 %
FEV6FVC-%Pred-Pre: 99 %
FVC-%Change-Post: 0 %
FVC-%Pred-Post: 112 %
FVC-%Pred-Pre: 113 %
FVC-Post: 3.86 L
FVC-Pre: 3.88 L
Post FEV1/FVC ratio: 90 %
Post FEV6/FVC ratio: 100 %
Pre FEV1/FVC ratio: 91 %
Pre FEV6/FVC Ratio: 100 %
RV % pred: 104 %
RV: 1.13 L
TLC % pred: 110 %
TLC: 5.09 L

## 2023-09-19 NOTE — Progress Notes (Signed)
 Full pft performed today.

## 2023-09-19 NOTE — Patient Instructions (Signed)
 Full pft performed today.

## 2023-10-18 ENCOUNTER — Telehealth: Payer: Self-pay

## 2023-10-18 ENCOUNTER — Encounter: Payer: Self-pay | Admitting: Pulmonary Disease

## 2023-10-18 ENCOUNTER — Ambulatory Visit: Admitting: Pulmonary Disease

## 2023-10-18 VITALS — BP 133/93 | HR 98 | Ht 61.0 in | Wt 191.0 lb

## 2023-10-18 DIAGNOSIS — J4540 Moderate persistent asthma, uncomplicated: Secondary | ICD-10-CM | POA: Diagnosis not present

## 2023-10-18 MED ORDER — BREZTRI AEROSPHERE 160-9-4.8 MCG/ACT IN AERO: 2.0000 | INHALATION_SPRAY | Freq: Two times a day (BID) | RESPIRATORY_TRACT | 11 refills | Status: AC

## 2023-10-18 NOTE — Telephone Encounter (Signed)
*  Pulm  Pharmacy Patient Advocate Encounter   Received notification from CoverMyMeds that prior authorization for Breztri  Aerosphere 160-9-4.8MCG/ACT aerosol  is required/requested.   Insurance verification completed.   The patient is insured through [facility] .   Per test claim: PA required; PA submitted to above mentioned insurance via CoverMyMeds Key/confirmation #/EOC [facility] Status is pending

## 2023-10-18 NOTE — Progress Notes (Signed)
 Synopsis: Referred in March 2025 for Asthma  Subjective:   PATIENT ID: Mary Rose GENDER: female DOB: [birth year], MRN: [id number]  HPI  Chief Complaint  Patient presents with   Follow-up   [name] is a 23 year old female with asthma who returns to pulmonary clinic for follow up.  Initial OV 08/30/23 She has been experiencing recent exacerbations of asthma, characterized by shortness of breath, wheezing, and chest tightness. There is difficulty differentiating between asthma attacks and anxiety attacks due to overlapping symptoms such as shortness of breath and a sensation of throat closing. She was diagnosed with asthma in elementary school but has not had consistent follow-up care over the years. Her last use of prednisone  was during an emergency room visit at the end of last year.  Current asthma management includes budesonide  via nebulizer, Symbicort , montelukast , and Ventolin . She uses Symbicort  more frequently than prescribed, sometimes up to eight times a day on bad days, due to worsening symptoms. No recent nighttime awakenings due to asthma symptoms. No waking up with chest tightness, shortness of breath, or wheezing. Cold air, smoke, strong perfumes, and colognes exacerbate her symptoms.  She has a history of anxiety and panic attacks since elementary school, which became more consistent over the past four years. Recently, she experiences panic attacks a couple of times a week. She attributes some improvement to recent weight loss over the past two months. She has been prescribed propranolol  for anxiety but uses it infrequently, having taken it only three times in the last six months.  She has a history of seasonal allergies, particularly in the summer, and reports that cold air, smoke, strong perfumes, and colognes exacerbate her symptoms. She takes Zyrtec and off-brand Claritin for allergy  management. She denies current smoking or vaping but was exposed to secondhand smoke  during childhood. Her mother reportedly had asthma when she was younger.  OV 10/18/23 She has been doing well on breztri  2 puffs twice daily with significant improvement in her symptoms since last visit. She has not needed her rescue inhaler as much.   Allergy  panel with total IgE level 978 with pan-positive allergens to grasses, trees and pet dander.  No night time awakenings    Past Medical History:  Diagnosis Date   Allergy     Anxiety    Asthma    High cholesterol      Family History  Problem Relation Age of Onset   Heart disease Father    Hyperlipidemia Father    Hypertension Father    Arthritis Maternal Grandmother    Birth defects Maternal Grandfather    Early death Maternal Grandfather      Social History   Socioeconomic History   Marital status: Single    Spouse name: Not on file   Number of children: Not on file   Years of education: Not on file   Highest education level: Some college, no degree  Occupational History   Not on file  Tobacco Use   Smoking status: Never   Smokeless tobacco: Never  Vaping Use   Vaping status: Never Used  Substance and Sexual Activity   Alcohol use: Never   Drug use: Never   Sexual activity: Yes    Birth control/protection: None  Other Topics Concern   Not on file  Social History Narrative   pt is a full time Consulting civil engineer at [employer], interested in pre-med   working part time   Social Drivers of Corporate investment banker Strain:  Medium Risk (07/07/2023)   Overall Financial Resource Strain (CARDIA)    Difficulty of Paying Living Expenses: Somewhat hard  Food Insecurity: Food Insecurity Present (07/07/2023)   Hunger Vital Sign    Worried About Running Out of Food in the Last Year: Sometimes true    Ran Out of Food in the Last Year: Patient declined  Transportation Needs: No Transportation Needs (07/07/2023)   PRAPARE - Administrator, Civil Service (Medical): No    Lack of Transportation (Non-Medical): No   Physical Activity: Insufficiently Active (07/07/2023)   Exercise Vital Sign    Days of Exercise per Week: 3 days    Minutes of Exercise per Session: 30 min  Stress: Stress Concern Present (07/07/2023)   [employer] of Occupational Health - Occupational Stress Questionnaire    Feeling of Stress : Rather much  Social Connections: Socially Isolated (07/07/2023)   Social Connection and Isolation Panel [NHANES]    Frequency of Communication with Friends and Family: Once a week    Frequency of Social Gatherings with Friends and Family: Once a week    Attends Religious Services: Never    Database administrator or Organizations: No    Attends Engineer, structural: Not on file    Marital Status: Never married  Catering manager Violence: Not on file     Allergies  Allergen Reactions   Molds & Smuts Shortness Of Breath     Outpatient Medications Prior to Visit  Medication Sig Dispense Refill   albuterol  (VENTOLIN  HFA) 108 (90 Base) MCG/ACT inhaler Inhale 2 puffs into the lungs every 4 (four) hours as needed for wheezing or shortness of breath. 54 g 1   budesonide  (PULMICORT ) 0.25 MG/2ML nebulizer solution USE 2 ML VIA NEBULIZER TWICE DAILY AS NEEDED 60 mL 7   cetirizine (ZYRTEC) 10 MG tablet Take by mouth.     Cholecalciferol (VITAMIN D3) 50 MCG (2000 UT) TABS Take 1 tablet by mouth at bedtime.     MAGNESIUM PO Take by mouth.     montelukast  (SINGULAIR ) 10 MG tablet Take 1 tablet (10 mg total) by mouth at bedtime. 90 tablet 1   propranolol  (INDERAL ) 20 MG tablet TAKE 1/2 TABLET BY MOUTH FOR ANXIETY FOR 1 WEEK THEN TAKE 1 TABLET AS NEEDED FOR ANXIETY AND SITUATIONAL ANXIETY 60 tablet 1   triamcinolone ointment (KENALOG) 0.1 %      Zinc 50 MG TABS Take 50 mg by mouth at bedtime.     budeson-glycopyrrolate-formoterol  (BREZTRI  AEROSPHERE) 160-9-4.8 MCG/ACT AERO 4 samples  0   budesonide -formoterol  (SYMBICORT ) 160-4.5 MCG/ACT inhaler Inhale 2 puffs into the lungs in the morning and  at bedtime. 30.6 g 0   No facility-administered medications prior to visit.   Review of Systems  Constitutional:  Negative for chills, fever, malaise/fatigue and weight loss.  HENT:  Negative for congestion, sinus pain and sore throat.   Eyes: Negative.   Respiratory:  Negative for cough, hemoptysis, sputum production, shortness of breath and wheezing.   Cardiovascular:  Negative for chest pain, palpitations, orthopnea, claudication and leg swelling.  Gastrointestinal:  Negative for abdominal pain, heartburn, nausea and vomiting.  Genitourinary: Negative.   Musculoskeletal:  Negative for joint pain and myalgias.  Skin:  Negative for rash.  Neurological:  Negative for weakness.  Endo/Heme/Allergies: Negative.    Objective:   Vitals:   10/18/23 1122  BP: (!) 133/93  Pulse: 98  SpO2: 98%  Weight: 191 lb (86.6 kg)  Height: 5\' 1"  (1.549  m)   Physical Exam Constitutional:      General: She is not in acute distress.    Appearance: Normal appearance.  Eyes:     General: No scleral icterus.    Conjunctiva/sclera: Conjunctivae normal.  Cardiovascular:     Rate and Rhythm: Normal rate and regular rhythm.  Pulmonary:     Breath sounds: No wheezing, rhonchi or rales.  Musculoskeletal:     Right lower leg: No edema.     Left lower leg: No edema.  Skin:    General: Skin is warm and dry.  Neurological:     General: No focal deficit present.    CBC    Component Value Date/Time   WBC 10.4 07/08/2023 1517   RBC 4.45 07/08/2023 1517   HGB 13.0 07/08/2023 1517   HGB 12.7 02/28/2020 0952   HCT 40.0 07/08/2023 1517   HCT 37.2 02/28/2020 0952   PLT 309 07/08/2023 1517   PLT 320 02/28/2020 0952   MCV 89.9 07/08/2023 1517   MCV 89 02/28/2020 0952   MCH 29.2 07/08/2023 1517   MCHC 32.5 07/08/2023 1517   RDW 12.4 07/08/2023 1517   RDW 12.3 02/28/2020 0952   LYMPHSABS 1.9 05/25/2022 0951   LYMPHSABS 2.0 02/28/2020 0952   MONOABS 0.6 05/25/2022 0951   EOSABS 312 07/08/2023 1517    EOSABS 0.5 (H) 02/28/2020 0952   BASOSABS 42 07/08/2023 1517   BASOSABS 0.0 02/28/2020 0952      Latest Ref Rng & Units 07/08/2023    3:17 PM 05/25/2022    9:51 AM 02/28/2020    9:52 AM  BMP  Glucose 65 - 99 mg/dL 76  83  88   BUN 7 - 25 mg/dL 8  12  8    Creatinine 0.50 - 0.96 mg/dL 8.29  5.62  1.30   BUN/Creat Ratio 6 - 22 (calc) SEE NOTE:   14   Sodium 135 - 146 mmol/L 137  139  139   Potassium 3.5 - 5.3 mmol/L 4.2  4.4  4.1   Chloride 98 - 110 mmol/L 103  102  102   CO2 20 - 32 mmol/L 22  28  23    Calcium 8.6 - 10.2 mg/dL 9.6  9.5  9.5    Chest imaging: CXR 05/19/23 The heart size and mediastinal contours are within normal limits. Both lungs are clear. The visualized skeletal structures are unremarkable.  PFT:    Latest Ref Rng & Units 09/19/2023    2:31 PM  PFT Results  FVC-Pre L 3.88   FVC-Predicted Pre % 113   FVC-Post L 3.86   FVC-Predicted Post % 112   Pre FEV1/FVC % % 91   Post FEV1/FCV % % 90   FEV1-Pre L 3.52   FEV1-Predicted Pre % 116   FEV1-Post L 3.47   DLCO uncorrected ml/min/mmHg 24.73   DLCO UNC% % 123   DLVA Predicted % 112   TLC L 5.09   TLC % Predicted % 110   RV % Predicted % 104     Labs: Absolute Eosinophil count 312 on 07/08/23  Path:  Echo:  Heart Catheterization:    Assessment & Plan:   Moderate persistent asthma without complication - Plan: budeson-glycopyrrolate-formoterol  (BREZTRI  AEROSPHERE) 160-9-4.8 MCG/ACT AERO inhaler  Discussion: Mary Rose is a 23 year old female with asthma who returns to pulmonary clinic for follow up.  Moderate Persistent Asthma - continue breztri  inhaler 2 puffs twice daily - Continue montelukast  and Zyrtec. - Use albuterol  as  needed, max every four hours. - She has elevated IgE and absolute eosinophil levels, will consider adding dupixent as next step if asthma becomes uncontrolled  Follow up in 1 year  Duaine German, MD [facility] Office:  [phone]    Current Outpatient Medications:    albuterol  (VENTOLIN  HFA) 108 (90 Base) MCG/ACT inhaler, Inhale 2 puffs into the lungs every 4 (four) hours as needed for wheezing or shortness of breath., Disp: 54 g, Rfl: 1   budeson-glycopyrrolate-formoterol  (BREZTRI  AEROSPHERE) 160-9-4.8 MCG/ACT AERO inhaler, Inhale 2 puffs into the lungs in the morning and at bedtime., Disp: 10.7 g, Rfl: 11   budesonide  (PULMICORT ) 0.25 MG/2ML nebulizer solution, USE 2 ML VIA NEBULIZER TWICE DAILY AS NEEDED, Disp: 60 mL, Rfl: 7   cetirizine (ZYRTEC) 10 MG tablet, Take by mouth., Disp: , Rfl:    Cholecalciferol (VITAMIN D3) 50 MCG (2000 UT) TABS, Take 1 tablet by mouth at bedtime., Disp: , Rfl:    MAGNESIUM PO, Take by mouth., Disp: , Rfl:    montelukast  (SINGULAIR ) 10 MG tablet, Take 1 tablet (10 mg total) by mouth at bedtime., Disp: 90 tablet, Rfl: 1   propranolol  (INDERAL ) 20 MG tablet, TAKE 1/2 TABLET BY MOUTH FOR ANXIETY FOR 1 WEEK THEN TAKE 1 TABLET AS NEEDED FOR ANXIETY AND SITUATIONAL ANXIETY, Disp: 60 tablet, Rfl: 1   triamcinolone ointment (KENALOG) 0.1 %, , Disp: , Rfl:    Zinc 50 MG TABS, Take 50 mg by mouth at bedtime., Disp: , Rfl:

## 2023-10-18 NOTE — Telephone Encounter (Signed)
 Approved today by [facility]  Medicaid [place] Case: [id number], Status: Approved, Coverage Starts on: 10/18/2023 12:00:00 AM, Coverage Ends on: 10/17/2024 12:00:00 AM. Effective Date: 10/18/2023 Authorization Expiration Date: 10/17/2024

## 2023-10-18 NOTE — Patient Instructions (Addendum)
 Continue breztri  inhaler 2 puffs twice daily - rinse mouth out after each use  Use albuterol  inhaler 1-2 puffs every 4-6 hours as needed  Your breathing tests are normal  Follow up in 1 year, call sooner if needed

## 2023-10-25 ENCOUNTER — Encounter: Payer: Self-pay | Admitting: Family

## 2023-10-25 ENCOUNTER — Ambulatory Visit: Admitting: Family

## 2023-10-25 VITALS — BP 114/79 | HR 88 | Temp 97.7°F | Ht 61.0 in | Wt 193.4 lb

## 2023-10-25 DIAGNOSIS — J4540 Moderate persistent asthma, uncomplicated: Secondary | ICD-10-CM

## 2023-10-25 DIAGNOSIS — J4520 Mild intermittent asthma, uncomplicated: Secondary | ICD-10-CM

## 2023-10-25 MED ORDER — MONTELUKAST SODIUM 10 MG PO TABS
10.0000 mg | ORAL_TABLET | Freq: Every evening | ORAL | 1 refills | Status: AC
Start: 2023-10-25 — End: ?

## 2023-10-25 NOTE — Assessment & Plan Note (Signed)
 Asthma is well-managed with Pulmicort  but will start a new inhaler, Breztri , prescribed by the pulmonologist and should not need the pulmicort  except for exacerbations. Albuterol  is used as needed. - Refill montelukast  prescription at [facility] - Continue current POC as prescribed by the pulmonologist. - Use albuterol  as needed. - F/U with pulmonologist or me in 6mos

## 2023-10-25 NOTE — Progress Notes (Addendum)
 Patient ID: Mary Rose, female    DOB: [birth year], 23 y.o.   MRN: [id number]  Chief Complaint  Patient presents with   Asthma  Discussed the use of AI scribe software for clinical note transcription with the patient, who gave verbal consent to proceed.  History of Present Illness Mary Rose is a 23 year old female with asthma and allergies who presents for a refill of montelukast .  She consistently uses montelukast  for asthma and allergies, along with Pulmicort  and a newly prescribed inhaler, Fresh tree. Albuterol  is used as needed, and she takes Zyrtec daily.  Her allergy  symptoms have worsened recently, with increased sneezing and itchy, burning eyes despite using eye drops and Claritin. Over-the-counter allergy  eye drops provide limited relief. Her throat feels affected due to frequent sneezing.  Assessment & Plan Asthma Asthma is well-managed with Pulmicort  via Neb prn, and a new inhaler, Breztri , prescribed by the pulmonologist. Albuterol  is used as needed. - Refill montelukast  prescription at [facility]. - Continue current POC as prescribed by the pulmonologist. - Use albuterol  as needed. - F/U with pulmonologist or me in 6mos  Allergic rhinitis Symptoms have worsened with increased sneezing and itchy eyes despite Zyrtec and over-the-counter allergy  eye drops. Fluctuating pollen levels may contribute to symptom exacerbation. - Continue Zyrtec daily. - Use over-the-counter allergy  eye drops as needed for itchy eyes. - F/U prn       Subjective:     Outpatient Medications Prior to Visit  Medication Sig Dispense Refill   albuterol  (VENTOLIN  HFA) 108 (90 Base) MCG/ACT inhaler Inhale 2 puffs into the lungs every 4 (four) hours as needed for wheezing or shortness of breath. 54 g 1   budeson-glycopyrrolate-formoterol  (BREZTRI  AEROSPHERE) 160-9-4.8 MCG/ACT AERO inhaler Inhale 2 puffs into the lungs in the morning and at bedtime. 10.7 g 11   budesonide  (PULMICORT ) 0.25 MG/2ML  nebulizer solution USE 2 ML VIA NEBULIZER TWICE DAILY AS NEEDED 60 mL 7   cetirizine (ZYRTEC) 10 MG tablet Take by mouth.     Cholecalciferol (VITAMIN D3) 50 MCG (2000 UT) TABS Take 1 tablet by mouth at bedtime.     MAGNESIUM PO Take by mouth.     montelukast  (SINGULAIR ) 10 MG tablet Take 1 tablet (10 mg total) by mouth at bedtime. 90 tablet 1   propranolol  (INDERAL ) 20 MG tablet TAKE 1/2 TABLET BY MOUTH FOR ANXIETY FOR 1 WEEK THEN TAKE 1 TABLET AS NEEDED FOR ANXIETY AND SITUATIONAL ANXIETY 60 tablet 1   triamcinolone ointment (KENALOG) 0.1 %      Zinc 50 MG TABS Take 50 mg by mouth at bedtime.     No facility-administered medications prior to visit.   Past Medical History:  Diagnosis Date   Allergy     Anxiety    Asthma    High cholesterol    Past Surgical History:  Procedure Laterality Date   WISDOM TOOTH EXTRACTION     Allergies  Allergen Reactions   Molds & Smuts Shortness Of Breath      Objective:    Physical Exam Vitals and nursing note reviewed.  Constitutional:      Appearance: Normal appearance.  Cardiovascular:     Rate and Rhythm: Normal rate and regular rhythm.  Pulmonary:     Effort: Pulmonary effort is normal.     Breath sounds: Normal breath sounds.  Musculoskeletal:        General: Normal range of motion.  Skin:    General: Skin is warm and dry.  Neurological:  Mental Status: She is alert.  Psychiatric:        Mood and Affect: Mood normal.        Behavior: Behavior normal.    BP 114/79 (BP Location: Left Arm, Patient Position: Sitting, Cuff Size: Large)   Pulse 88   Temp 97.7 F (36.5 C) (Temporal)   Ht 5\' 1"  (1.549 m)   Wt 193 lb 6.4 oz (87.7 kg)   LMP 10/05/2023 (Exact Date)   SpO2 96%   BMI 36.54 kg/m  Wt Readings from Last 3 Encounters:  10/25/23 193 lb 6.4 oz (87.7 kg)  10/18/23 191 lb (86.6 kg)  09/19/23 195 lb 9.6 oz (88.7 kg)      Versa Gore, NP

## 2023-11-17 ENCOUNTER — Encounter

## 2023-11-30 ENCOUNTER — Ambulatory Visit: Admitting: Pulmonary Disease

## 2024-02-17 ENCOUNTER — Other Ambulatory Visit: Payer: Self-pay | Admitting: Medical Genetics

## 2024-04-03 ENCOUNTER — Other Ambulatory Visit: Payer: Self-pay

## 2024-04-03 DIAGNOSIS — J45909 Unspecified asthma, uncomplicated: Secondary | ICD-10-CM | POA: Insufficient documentation

## 2024-04-03 DIAGNOSIS — Z7951 Long term (current) use of inhaled steroids: Secondary | ICD-10-CM | POA: Diagnosis not present

## 2024-04-03 DIAGNOSIS — K8020 Calculus of gallbladder without cholecystitis without obstruction: Secondary | ICD-10-CM | POA: Insufficient documentation

## 2024-04-03 DIAGNOSIS — R1013 Epigastric pain: Secondary | ICD-10-CM | POA: Diagnosis present

## 2024-04-03 LAB — URINALYSIS, ROUTINE W REFLEX MICROSCOPIC
Bilirubin Urine: NEGATIVE
Glucose, UA: NEGATIVE mg/dL
Leukocytes,Ua: NEGATIVE
Nitrite: NEGATIVE
Specific Gravity, Urine: 1.031 — ABNORMAL HIGH (ref 1.005–1.030)
pH: 5.5 (ref 5.0–8.0)

## 2024-04-03 LAB — CBC
HCT: 38.5 % (ref 36.0–46.0)
Hemoglobin: 12.7 g/dL (ref 12.0–15.0)
MCH: 30.2 pg (ref 26.0–34.0)
MCHC: 33 g/dL (ref 30.0–36.0)
MCV: 91.7 fL (ref 80.0–100.0)
Platelets: 307 K/uL (ref 150–400)
RBC: 4.2 MIL/uL (ref 3.87–5.11)
RDW: 12.2 % (ref 11.5–15.5)
WBC: 13.3 K/uL — ABNORMAL HIGH (ref 4.0–10.5)
nRBC: 0 % (ref 0.0–0.2)

## 2024-04-03 LAB — PREGNANCY, URINE: Preg Test, Ur: NEGATIVE

## 2024-04-03 NOTE — ED Triage Notes (Signed)
 One instance of diarrhea followed by sudden onset of epigastric pain 2200. Radiating up into both shoulders.   Took kaopectate.

## 2024-04-04 ENCOUNTER — Emergency Department (HOSPITAL_BASED_OUTPATIENT_CLINIC_OR_DEPARTMENT_OTHER)
Admission: EM | Admit: 2024-04-04 | Discharge: 2024-04-04 | Disposition: A | Discharge status: Home / Self Care | Attending: Emergency Medicine | Admitting: Emergency Medicine

## 2024-04-04 ENCOUNTER — Emergency Department (HOSPITAL_BASED_OUTPATIENT_CLINIC_OR_DEPARTMENT_OTHER): Admitting: Radiology

## 2024-04-04 ENCOUNTER — Encounter (HOSPITAL_BASED_OUTPATIENT_CLINIC_OR_DEPARTMENT_OTHER): Payer: Self-pay

## 2024-04-04 ENCOUNTER — Emergency Department (HOSPITAL_BASED_OUTPATIENT_CLINIC_OR_DEPARTMENT_OTHER)

## 2024-04-04 DIAGNOSIS — K8020 Calculus of gallbladder without cholecystitis without obstruction: Secondary | ICD-10-CM

## 2024-04-04 DIAGNOSIS — Z91041 Radiographic dye allergy status: Secondary | ICD-10-CM

## 2024-04-04 DIAGNOSIS — R1013 Epigastric pain: Secondary | ICD-10-CM

## 2024-04-04 LAB — COMPREHENSIVE METABOLIC PANEL WITH GFR
ALT: 19 U/L (ref 0–44)
AST: 36 U/L (ref 15–41)
Albumin: 4.3 g/dL (ref 3.5–5.0)
Alkaline Phosphatase: 59 U/L (ref 38–126)
Anion gap: 10 (ref 5–15)
BUN: 12 mg/dL (ref 6–20)
CO2: 26 mmol/L (ref 22–32)
Calcium: 9.5 mg/dL (ref 8.9–10.3)
Chloride: 104 mmol/L (ref 98–111)
Creatinine, Ser: 0.63 mg/dL (ref 0.44–1.00)
GFR, Estimated: >60 mL/min (ref >60)
Glucose, Bld: 96 mg/dL (ref 70–99)
Potassium: 4.1 mmol/L (ref 3.5–5.1)
Sodium: 140 mmol/L (ref 135–145)
Total Bilirubin: 0.4 mg/dL (ref 0.0–1.2)
Total Protein: 7.1 g/dL (ref 6.5–8.1)

## 2024-04-04 LAB — TROPONIN T, HIGH SENSITIVITY: Troponin T High Sensitivity: <15 ng/L (ref 0–19)

## 2024-04-04 LAB — LIPASE, BLOOD: Lipase: 25 U/L (ref 11–51)

## 2024-04-04 LAB — D-DIMER, QUANTITATIVE: D-Dimer, Quant: 0.58 ug{FEU}/mL — ABNORMAL HIGH (ref 0.00–0.50)

## 2024-04-04 MED ORDER — DIPHENHYDRAMINE HCL 50 MG/ML IJ SOLN
25.0000 mg | Freq: Once | INTRAMUSCULAR | Status: AC
  Administered 2024-04-04: 25 mg via INTRAVENOUS
  Filled 2024-04-04: qty 1

## 2024-04-04 MED ORDER — IOHEXOL 350 MG/ML SOLN
100.0000 mL | Freq: Once | INTRAVENOUS | Status: AC | PRN
  Administered 2024-04-04: 100 mL via INTRAVENOUS

## 2024-04-04 MED ORDER — METHYLPREDNISOLONE SODIUM SUCC 125 MG IJ SOLR
125.0000 mg | Freq: Once | INTRAMUSCULAR | Status: AC
  Administered 2024-04-04: 125 mg via INTRAVENOUS
  Filled 2024-04-04: qty 2

## 2024-04-04 MED ORDER — FAMOTIDINE IN NACL 20-0.9 MG/50ML-% IV SOLN
20.0000 mg | Freq: Once | INTRAVENOUS | Status: AC
  Administered 2024-04-04: 20 mg via INTRAVENOUS
  Filled 2024-04-04: qty 50

## 2024-04-04 MED ORDER — FAMOTIDINE 20 MG PO TABS
20.0000 mg | ORAL_TABLET | Freq: Two times a day (BID) | ORAL | 0 refills | Status: AC
Start: 2024-04-04 — End: ?

## 2024-04-04 MED ORDER — OMEPRAZOLE 20 MG PO CPDR: 20.0000 mg | DELAYED_RELEASE_CAPSULE | Freq: Every day | ORAL | 0 refills | Status: AC

## 2024-04-04 MED ORDER — ALUM & MAG HYDROXIDE-SIMETH 200-200-20 MG/5ML PO SUSP
30.0000 mL | Freq: Once | ORAL | Status: AC
  Administered 2024-04-04: 30 mL via ORAL
  Filled 2024-04-04: qty 30

## 2024-04-04 MED ORDER — LIDOCAINE VISCOUS HCL 2 % MT SOLN
15.0000 mL | Freq: Once | OROMUCOSAL | Status: AC
  Administered 2024-04-04: 15 mL via OROMUCOSAL
  Filled 2024-04-04: qty 15

## 2024-04-04 NOTE — ED Provider Notes (Signed)
 Patient care signed out to reassess after CT scan.  Patient had workup in the ER which found gallstones and clinical concern for reflux/gastritis.  Patient improved on reassessment mild elevated blood pressure.  Patient had mild swelling after contrast discussed likely the cause of sensitivity/allergy .  Discussed in the future to let anyone know before receiving contrast.  Patient received steroids and Benadryl.  Discussed Benadryl as needed and supportive care at home.  Patient has significant other to support at home.   Tonia Chew, MD 04/04/24 [phone]

## 2024-04-04 NOTE — ED Notes (Signed)

## 2024-04-04 NOTE — ED Notes (Signed)
 Patient reports left eye swelling after receiving contrast. Reports some blurry vision in left eye. Denies difficulty breathing. MD notified. New orders placed.

## 2024-04-04 NOTE — Discharge Instructions (Addendum)
 Testing is reassuring.  No evidence of heart attack or blood clot in the lung.  Suspect your pain likely from acid reflux or gastritis.  You do have some gallstones but they do not appear to be causing issues today.  Follow-up with the surgeon for further assessment of this.  Take this medication as prescribed.  Avoid alcohol caffeine, NSAID medications, spicy foods.  It appears you are allergic to IV contrast and should not receive that medication in the future. If symptoms persist you may follow-up with a gastroenterologist for consideration of endoscopy.  Use Benadryl every 6 hours as needed for itching or swelling.  Return to emergency room for throat closing sensation or breathing difficulty.

## 2024-04-04 NOTE — ED Provider Notes (Signed)
 [place] EMERGENCY DEPARTMENT AT [facility] Provider Note   CSN: [id number] Arrival date & time: 04/03/24  2300     Patient presents with: Abdominal Pain   Mary Rose is a 23 y.o. female.   Patient with history of asthma presents with severe epigastric abdominal pain that onset last night about 10 PM.  Symptoms started while she was in bed resting.  Pain lasted for several hours and is starting to improve.  Did radiate to her chest and bilateral shoulders.  Associate with nausea, feeling like she was going to pass out and lightheadedness.  Feels improved now.  Did have 1 episode of diarrhea and nausea but no vomiting.  No fever.  Felt burning in her chest that went to both shoulders.  No difficulty breathing.  No cough or fever.  Still has appendix and gallbladder.  No history of acid reflux or ulcers.Has had similar pain in the past but never this severe.  No pain with urination or blood in the urine.  No vaginal bleeding or discharge.  The history is provided by the patient.  Abdominal Pain Associated symptoms: nausea   Associated symptoms: no chest pain, no dysuria, no fever, no hematuria, no shortness of breath and no vomiting        Prior to Admission medications   Medication Sig Start Date End Date Taking? Authorizing Provider  albuterol  (VENTOLIN  HFA) 108 (90 Base) MCG/ACT inhaler Inhale 2 puffs into the lungs every 4 (four) hours as needed for wheezing or shortness of breath. 05/19/23   Roemhildt, Lorin T, PA-C  budeson-glycopyrrolate-formoterol  (BREZTRI  AEROSPHERE) 160-9-4.8 MCG/ACT AERO inhaler Inhale 2 puffs into the lungs in the morning and at bedtime. 10/18/23   Kara Dorn NOVAK, MD  budesonide  (PULMICORT ) 0.25 MG/2ML nebulizer solution USE 2 ML VIA NEBULIZER TWICE DAILY AS NEEDED 05/19/23   Roemhildt, Lorin T, PA-C  cetirizine (ZYRTEC) 10 MG tablet Take by mouth.    [provider]  Cholecalciferol (VITAMIN D3) 50 MCG (2000 UT) TABS Take 1 tablet  by mouth at bedtime.    [provider]  MAGNESIUM PO Take by mouth.    [provider]  montelukast  (SINGULAIR ) 10 MG tablet Take 1 tablet (10 mg total) by mouth at bedtime. 10/25/23   Lucius Krabbe, NP  propranolol  (INDERAL ) 20 MG tablet TAKE 1/2 TABLET BY MOUTH FOR ANXIETY FOR 1 WEEK THEN TAKE 1 TABLET AS NEEDED FOR ANXIETY AND SITUATIONAL ANXIETY 07/08/23   Lucius Krabbe, NP  triamcinolone ointment (KENALOG) 0.1 %  07/01/18   [provider]  Zinc 50 MG TABS Take 50 mg by mouth at bedtime.    [provider]    Allergies: Molds & smuts    Review of Systems  Constitutional:  Positive for activity change and appetite change. Negative for fever.  HENT:  Negative for congestion and rhinorrhea.   Respiratory:  Positive for chest tightness. Negative for shortness of breath.   Cardiovascular:  Negative for chest pain.  Gastrointestinal:  Positive for abdominal pain and nausea. Negative for vomiting.  Genitourinary:  Negative for dysuria and hematuria.  Musculoskeletal:  Negative for arthralgias and myalgias.  Skin:  Negative for rash.  Neurological:  Positive for light-headedness. Negative for dizziness and weakness.   all other systems are negative except as noted in the HPI and PMH.    Updated Vital Signs BP (!) 147/83 (BP Location: Right Arm)   Pulse 73   Temp (!) 97.4 F (36.3 C)   Resp (!)  22   SpO2 100%   Physical Exam Vitals and nursing note reviewed.  Constitutional:      General: She is not in acute distress.    Appearance: She is well-developed.  HENT:     Head: Normocephalic and atraumatic.     Mouth/Throat:     Pharynx: No oropharyngeal exudate.  Eyes:     Conjunctiva/sclera: Conjunctivae normal.     Pupils: Pupils are equal, round, and reactive to light.  Neck:     Comments: No meningismus. Cardiovascular:     Rate and Rhythm: Normal rate and regular rhythm.     Heart sounds: Normal heart sounds. No murmur  heard. Pulmonary:     Effort: Pulmonary effort is normal. No respiratory distress.     Breath sounds: Normal breath sounds.  Abdominal:     Palpations: Abdomen is soft.     Tenderness: There is abdominal tenderness. There is guarding. There is no rebound.     Comments: Epigastric and right upper quadrant tenderness.  No guarding or rebound.  No lower abdominal tenderness  Musculoskeletal:        General: No tenderness. Normal range of motion.     Cervical back: Normal range of motion and neck supple.  Skin:    General: Skin is warm.  Neurological:     Mental Status: She is alert and oriented to person, place, and time.     Cranial Nerves: No cranial nerve deficit.     Motor: No abnormal muscle tone.     Coordination: Coordination normal.     Comments:  5/5 strength throughout. CN 2-12 intact.Equal grip strength.   Psychiatric:        Behavior: Behavior normal.     (all labs ordered are listed, but only abnormal results are displayed) Labs Reviewed  CBC - Abnormal; Notable for the following components:      Result Value   WBC 13.3 (*)    All other components within normal limits  URINALYSIS, ROUTINE W REFLEX MICROSCOPIC - Abnormal; Notable for the following components:   APPearance HAZY (*)    Specific Gravity, Urine 1.031 (*)    Hgb urine dipstick LARGE (*)    Ketones, ur TRACE (*)    Protein, ur TRACE (*)    Bacteria, UA RARE (*)    All other components within normal limits  LIPASE, BLOOD  COMPREHENSIVE METABOLIC PANEL WITH GFR  PREGNANCY, URINE  D-DIMER, QUANTITATIVE  TROPONIN T, HIGH SENSITIVITY    EKG: None  Radiology: No results found.   Procedures   Medications Ordered in the ED  alum & mag hydroxide-simeth (MAALOX/MYLANTA) 200-200-20 MG/5ML suspension 30 mL (has no administration in time range)  lidocaine (XYLOCAINE) 2 % viscous mouth solution 15 mL (has no administration in time range)                                    Medical Decision  Making Amount and/or Complexity of Data Reviewed Labs: ordered. Decision-making details documented in ED Course. Radiology: ordered and independent interpretation performed. Decision-making details documented in ED Course. ECG/medicine tests: ordered and independent interpretation performed. Decision-making details documented in ED Course.  Risk OTC drugs. Prescription drug management.   Upper abdominal pain with nausea and burning sensation in chest and shoulders.  Stable vitals.  No distress.  Abdomen soft without peritoneal signs but tender in the right upper quadrant epigastrium.  EKG without acute  ST changes.  Low concern for ACS.  Will obtain labs including LFTs and lipase.  Leukocytosis of 13 noted.  Workup remarkable for cholelithiasis without cholecystitis.  LFTs and lipase are normal.  Does have leukocytosis.  ultrasound negative for cholecystitis.  Upon returning from CT scan, patient developed some eyelid swelling and itchiness.  Developed eye itchiness and eyelid swelling after IV contrast.  Denies tongue swelling, lip swelling, difficulty breathing or difficulty swallowing.  No chest pain or shortness of breath.  She is given steroids and antihistamines.  IV contrast added to allergy  list.  Patient made aware of CT findings including gallstones as well as ovarian cyst.  Discussed avoiding IV contrast in the future. Will initiate PPI.  Follow-up with PCP as well as general surgery given her gallstones.  Will observe additional 1 hour in the ED to ensure facial swelling resolves after IV contrast and does not recur.  Patient cautioned to avoid IV contrast in the future.  Added to allergy  list.  Anticipate discharge home if symptoms and facial swelling have improved.     Final diagnoses:  Epigastric pain  Gallstones    ED Discharge Orders     None          Anaya Bovee, Garnette, MD 04/04/24 0710

## 2024-04-09 ENCOUNTER — Ambulatory Visit: Payer: Self-pay | Admitting: Surgery

## 2024-05-02 ENCOUNTER — Other Ambulatory Visit (HOSPITAL_COMMUNITY)
Admission: RE | Admit: 2024-05-02 | Discharge: 2024-05-02 | Disposition: A | Discharge status: Home / Self Care | Payer: Self-pay | Source: Ambulatory Visit | Attending: Medical Genetics | Admitting: Medical Genetics

## 2024-05-11 ENCOUNTER — Other Ambulatory Visit: Payer: Self-pay

## 2024-05-11 ENCOUNTER — Encounter (HOSPITAL_BASED_OUTPATIENT_CLINIC_OR_DEPARTMENT_OTHER): Payer: Self-pay

## 2024-05-11 DIAGNOSIS — K8050 Calculus of bile duct without cholangitis or cholecystitis without obstruction: Secondary | ICD-10-CM | POA: Insufficient documentation

## 2024-05-11 MED ORDER — ONDANSETRON 4 MG PO TBDP
4.0000 mg | ORAL_TABLET | Freq: Once | ORAL | Status: AC
  Administered 2024-05-12: 4 mg via ORAL
  Filled 2024-05-11: qty 1

## 2024-05-11 NOTE — ED Triage Notes (Signed)
 Pt states that she began having sharp epigastric pain that radiates into bilateral shoulders. Pt also reports nausea. She states taht she was seen 2 months ago and was diagnosed with gallstones and pain is similar.

## 2024-05-12 ENCOUNTER — Emergency Department (HOSPITAL_BASED_OUTPATIENT_CLINIC_OR_DEPARTMENT_OTHER)
Admission: EM | Admit: 2024-05-12 | Discharge: 2024-05-12 | Disposition: A | Attending: Emergency Medicine | Admitting: Emergency Medicine

## 2024-05-12 DIAGNOSIS — K8050 Calculus of bile duct without cholangitis or cholecystitis without obstruction: Secondary | ICD-10-CM

## 2024-05-12 LAB — COMPREHENSIVE METABOLIC PANEL WITH GFR
ALT: 24 U/L (ref 0–44)
AST: 45 U/L — ABNORMAL HIGH (ref 15–41)
Albumin: 4.7 g/dL (ref 3.5–5.0)
Alkaline Phosphatase: 67 U/L (ref 38–126)
Anion gap: 16 — ABNORMAL HIGH (ref 5–15)
BUN: 13 mg/dL (ref 6–20)
CO2: 23 mmol/L (ref 22–32)
Calcium: 9.7 mg/dL (ref 8.9–10.3)
Chloride: 103 mmol/L (ref 98–111)
Creatinine, Ser: 0.71 mg/dL (ref 0.44–1.00)
GFR, Estimated: >60 mL/min (ref >60)
Glucose, Bld: 87 mg/dL (ref 70–99)
Potassium: 3.9 mmol/L (ref 3.5–5.1)
Sodium: 142 mmol/L (ref 135–145)
Total Bilirubin: 0.6 mg/dL (ref 0.0–1.2)
Total Protein: 7.6 g/dL (ref 6.5–8.1)

## 2024-05-12 LAB — CBC
HCT: 41.1 % (ref 36.0–46.0)
Hemoglobin: 13.6 g/dL (ref 12.0–15.0)
MCH: 30.6 pg (ref 26.0–34.0)
MCHC: 33.1 g/dL (ref 30.0–36.0)
MCV: 92.4 fL (ref 80.0–100.0)
Platelets: 299 K/uL (ref 150–400)
RBC: 4.45 MIL/uL (ref 3.87–5.11)
RDW: 12.5 % (ref 11.5–15.5)
WBC: 11 K/uL — ABNORMAL HIGH (ref 4.0–10.5)
nRBC: 0 % (ref 0.0–0.2)

## 2024-05-12 LAB — URINALYSIS, ROUTINE W REFLEX MICROSCOPIC
Bilirubin Urine: NEGATIVE
Glucose, UA: NEGATIVE mg/dL
Hgb urine dipstick: NEGATIVE
Ketones, ur: NEGATIVE mg/dL
Leukocytes,Ua: NEGATIVE
Nitrite: NEGATIVE
Specific Gravity, Urine: 1.032 — ABNORMAL HIGH (ref 1.005–1.030)
pH: 6.5 (ref 5.0–8.0)

## 2024-05-12 LAB — PREGNANCY, URINE: Preg Test, Ur: NEGATIVE

## 2024-05-12 LAB — LIPASE, BLOOD: Lipase: 26 U/L (ref 11–51)

## 2024-05-12 MED ORDER — FENTANYL CITRATE (PF) 50 MCG/ML IJ SOSY
50.0000 ug | PREFILLED_SYRINGE | Freq: Once | INTRAMUSCULAR | Status: AC
  Administered 2024-05-12: 50 ug via INTRAVENOUS
  Filled 2024-05-12: qty 1

## 2024-05-12 MED ORDER — HYDROCODONE-ACETAMINOPHEN 5-325 MG PO TABS
1.0000 | ORAL_TABLET | Freq: Once | ORAL | Status: AC
  Administered 2024-05-12: 1 via ORAL
  Filled 2024-05-12: qty 1

## 2024-05-12 MED ORDER — ONDANSETRON 8 MG PO TBDP: 8.0000 mg | ORAL_TABLET | Freq: Three times a day (TID) | ORAL | 0 refills | Status: AC | PRN

## 2024-05-12 MED ORDER — HYDROCODONE-ACETAMINOPHEN 5-325 MG PO TABS: 1.0000 | ORAL_TABLET | Freq: Four times a day (QID) | ORAL | 0 refills | Status: AC | PRN

## 2024-05-12 MED ORDER — ONDANSETRON HCL 4 MG/2ML IJ SOLN
4.0000 mg | Freq: Once | INTRAMUSCULAR | Status: AC
  Administered 2024-05-12: 4 mg via INTRAVENOUS
  Filled 2024-05-12: qty 2

## 2024-05-12 NOTE — ED Provider Notes (Signed)
 [place] EMERGENCY DEPARTMENT AT [facility] Provider Note   CSN: [id number] Arrival date & time: 05/11/24  2340     Patient presents with: Abdominal Pain   Mary Rose is a 23 y.o. female.   The history is provided by the patient.  Patient presents with abdominal pain.  She reports the pain starts in the epigastric region and radiates up into her shoulders.  She reports associated nausea.  Overall her symptoms are improving.  No fevers or vomiting.  No active chest pain at this time. No previous abdominal surgeries.   She had similar symptoms earlier in November, was seen by general surgery and told she has cholelithiasis.  She is still contemplating surgery. She does report eating fatty/greasy foods over the past 1-2 weeks  Prior to Admission medications   Medication Sig Start Date End Date Taking? Authorizing Provider  HYDROcodone -acetaminophen  (NORCO/VICODIN) 5-325 MG tablet Take 1 tablet by mouth every 6 (six) hours as needed for severe pain (pain score 7-10). 05/12/24  Yes Midge Golas, MD  ondansetron  (ZOFRAN -ODT) 8 MG disintegrating tablet Take 1 tablet (8 mg total) by mouth every 8 (eight) hours as needed for vomiting or nausea. 05/12/24  Yes Midge Golas, MD  albuterol  (VENTOLIN  HFA) 108 (90 Base) MCG/ACT inhaler Inhale 2 puffs into the lungs every 4 (four) hours as needed for wheezing or shortness of breath. 05/19/23   Roemhildt, Lorin T, PA-C  budeson-glycopyrrolate-formoterol  (BREZTRI  AEROSPHERE) 160-9-4.8 MCG/ACT AERO inhaler Inhale 2 puffs into the lungs in the morning and at bedtime. 10/18/23   Kara Dorn NOVAK, MD  budesonide  (PULMICORT ) 0.25 MG/2ML nebulizer solution USE 2 ML VIA NEBULIZER TWICE DAILY AS NEEDED 05/19/23   Roemhildt, Lorin T, PA-C  cetirizine (ZYRTEC) 10 MG tablet Take by mouth.    [provider]  Cholecalciferol (VITAMIN D3) 50 MCG (2000 UT) TABS Take 1 tablet by mouth at bedtime.    [provider]  famotidine   (PEPCID ) 20 MG tablet Take 1 tablet (20 mg total) by mouth 2 (two) times daily. 04/04/24   Carita Senior, MD  MAGNESIUM PO Take by mouth.    [provider]  montelukast  (SINGULAIR ) 10 MG tablet Take 1 tablet (10 mg total) by mouth at bedtime. 10/25/23   Lucius Krabbe, NP  omeprazole  (PRILOSEC) 20 MG capsule Take 1 capsule (20 mg total) by mouth daily. 04/04/24   Rancour, Senior, MD  propranolol  (INDERAL ) 20 MG tablet TAKE 1/2 TABLET BY MOUTH FOR ANXIETY FOR 1 WEEK THEN TAKE 1 TABLET AS NEEDED FOR ANXIETY AND SITUATIONAL ANXIETY 07/08/23   Lucius Krabbe, NP  triamcinolone ointment (KENALOG) 0.1 %  07/01/18   [provider]  Zinc 50 MG TABS Take 50 mg by mouth at bedtime.    [provider]    Allergies: Ivp dye [iodinated contrast media] and Molds & smuts    Review of Systems  Constitutional:  Negative for fever.  Gastrointestinal:  Positive for nausea. Negative for blood in stool, constipation, diarrhea and vomiting.  Genitourinary:  Negative for dysuria, vaginal bleeding and vaginal discharge.    Updated Vital Signs BP 134/82   Pulse 94   Temp 98.5 F (36.9 C) (Oral)   Resp 16   LMP 04/27/2024 (Exact Date)   SpO2 99%   Physical Exam CONSTITUTIONAL: Well developed/well nourished HEAD: Normocephalic/atraumatic EYES: EOMI/PERRL, no icterus ENMT: Mucous membranes moist NECK: supple no meningeal signs CV: S1/S2 noted, no murmurs/rubs/gallops noted LUNGS: Lungs are clear to auscultation bilaterally, no apparent distress ABDOMEN: soft,  mild epigastric tenderness, no rebound or guarding, bowel sounds noted throughout abdomen GU:no cva tenderness NEURO: Pt is awake/alert/appropriate, moves all extremitiesx4.  No facial droop.   EXTREMITIES: pulses normal/equal, full ROM SKIN: warm, color normal  (all labs ordered are listed, but only abnormal results are displayed) Labs Reviewed  COMPREHENSIVE METABOLIC PANEL WITH GFR - Abnormal; Notable for  the following components:      Result Value   AST 45 (*)    Anion gap 16 (*)    All other components within normal limits  CBC - Abnormal; Notable for the following components:   WBC 11.0 (*)    All other components within normal limits  URINALYSIS, ROUTINE W REFLEX MICROSCOPIC - Abnormal; Notable for the following components:   Specific Gravity, Urine 1.032 (*)    Protein, ur TRACE (*)    All other components within normal limits  LIPASE, BLOOD  PREGNANCY, URINE    EKG: None  Radiology: No results found.   Procedures   Medications Ordered in the ED  ondansetron  (ZOFRAN -ODT) disintegrating tablet 4 mg (4 mg Oral Given 05/12/24 0006)  fentaNYL  (SUBLIMAZE ) injection 50 mcg (50 mcg Intravenous Given 05/12/24 0157)  ondansetron  (ZOFRAN ) injection 4 mg (4 mg Intravenous Given 05/12/24 0157)  HYDROcodone -acetaminophen  (NORCO/VICODIN) 5-325 MG per tablet 1 tablet (1 tablet Oral Given 05/12/24 0315)                                    Medical Decision Making Amount and/or Complexity of Data Reviewed Labs: ordered.  Risk Prescription drug management.   This patient presents to the ED for concern of abdominal pain, this involves an extensive number of treatment options, and is a complaint that carries with it a high risk of complications and morbidity.  The differential diagnosis includes but is not limited to cholecystitis, cholelithiasis, pancreatitis, gastritis, peptic ulcer disease, appendicitis, bowel obstruction, bowel perforation, diverticulitis, ectopic pregnancy, PID, UTI   Social Determinants of Health: Patient's impaired access to primary care  increases the complexity of managing their presentation  Additional history obtained: Records reviewed Care Everywhere/External Records  Lab Tests: I Ordered, and personally interpreted labs.  The pertinent results include: Labs overall reassuring   Medicines ordered and prescription drug management: I ordered medication  including fentanyl  for pain Reevaluation of the patient after these medicines showed that the patient    improved  Test Considered: CT imaging was considered, but patient already has known cholelithiasis   Reevaluation: After the interventions noted above, I reevaluated the patient and found that they have :improved  Complexity of problems addressed: Patient's presentation is most consistent with  acute presentation with potential threat to life or bodily function  Disposition: After consideration of the diagnostic results and the patient's response to treatment,  I feel that the patent would benefit from discharge  .    Patient presents for symptoms of cholelithiasis.  She had similar episodes earlier in the fall and already had general surgery follow-up, she is still contemplating surgery She admits to poor dietary choices recently.  This likely contributed to her symptoms.  She is now feeling improved, and is motivated to follow-up with general surgery.  Will provide short course of antiemetics and analgesics     Final diagnoses:  Biliary colic    ED Discharge Orders          Ordered    ondansetron  (ZOFRAN -ODT) 8 MG disintegrating tablet  Every 8 hours PRN        05/12/24 0330    HYDROcodone -acetaminophen  (NORCO/VICODIN) 5-325 MG tablet  Every 6 hours PRN        05/12/24 0330               Midge Golas, MD 05/12/24 [phone]

## 2024-05-16 LAB — GENECONNECT MOLECULAR SCREEN: Genetic Analysis Overall Interpretation: NEGATIVE

## 2024-06-27 ENCOUNTER — Other Ambulatory Visit: Payer: Self-pay | Admitting: Physician Assistant

## 2024-06-27 ENCOUNTER — Encounter: Payer: Self-pay | Admitting: Physician Assistant

## 2024-06-27 DIAGNOSIS — N6452 Nipple discharge: Secondary | ICD-10-CM

## 2024-06-27 DIAGNOSIS — N83201 Unspecified ovarian cyst, right side: Secondary | ICD-10-CM

## 2024-07-05 ENCOUNTER — Ambulatory Visit
Admission: RE | Admit: 2024-07-05 | Discharge: 2024-07-05 | Disposition: A | Discharge status: Home / Self Care | Source: Ambulatory Visit | Attending: Physician Assistant | Admitting: Physician Assistant

## 2024-07-05 DIAGNOSIS — N83201 Unspecified ovarian cyst, right side: Secondary | ICD-10-CM

## 2024-07-13 ENCOUNTER — Ambulatory Visit: Admission: RE | Admit: 2024-07-13 | Source: Ambulatory Visit

## 2024-07-13 DIAGNOSIS — N6452 Nipple discharge: Secondary | ICD-10-CM
